# Patient Record
Sex: Female | Born: 1995 | Race: White | Hispanic: No | Marital: Single | Smoking: Former smoker
Health system: Southern US, Community
[De-identification: ages and names within clinical notes are randomized; demographics above are authoritative.]

## PROBLEM LIST (undated history)

## (undated) DIAGNOSIS — Z789 Other specified health status: Secondary | ICD-10-CM

## (undated) DIAGNOSIS — K029 Dental caries, unspecified: Secondary | ICD-10-CM

## (undated) HISTORY — DX: Dental caries, unspecified: K02.9

## (undated) HISTORY — PX: TYMPANOSTOMY TUBE PLACEMENT: SHX32

## (undated) HISTORY — DX: Other specified health status: Z78.9

---
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
## 2016-07-02 ENCOUNTER — Emergency Department (HOSPITAL_COMMUNITY)
Admission: EM | Admit: 2016-07-02 | Discharge: 2016-07-02 | Disposition: A | Discharge status: Home / Self Care | Payer: Medicaid Other | Attending: Emergency Medicine | Admitting: Emergency Medicine

## 2016-07-02 ENCOUNTER — Encounter (HOSPITAL_COMMUNITY): Payer: Self-pay | Admitting: Emergency Medicine

## 2016-07-02 DIAGNOSIS — F17210 Nicotine dependence, cigarettes, uncomplicated: Secondary | ICD-10-CM | POA: Insufficient documentation

## 2016-07-02 DIAGNOSIS — H00014 Hordeolum externum left upper eyelid: Secondary | ICD-10-CM | POA: Insufficient documentation

## 2016-07-02 DIAGNOSIS — H00016 Hordeolum externum left eye, unspecified eyelid: Secondary | ICD-10-CM

## 2016-07-02 DIAGNOSIS — H5712 Ocular pain, left eye: Secondary | ICD-10-CM | POA: Diagnosis present

## 2016-07-02 MED ORDER — TOBRAMYCIN 0.3 % OP SOLN
2.0000 [drp] | Freq: Once | OPHTHALMIC | Status: AC
  Administered 2016-07-02: 2 [drp] via OPHTHALMIC
  Filled 2016-07-02: qty 5

## 2016-07-02 MED ORDER — DEXAMETHASONE 4 MG PO TABS: 4.0000 mg | ORAL_TABLET | Freq: Two times a day (BID) | ORAL | Status: DC

## 2016-07-02 NOTE — ED Notes (Signed)
PT c/o left eye swelling and pain with upper eyelid x2 days with no injury or known irritant.

## 2016-07-02 NOTE — Discharge Instructions (Signed)
Stye A stye is a bump on your eyelid caused by a bacterial infection. A stye can form inside the eyelid (internal stye) or outside the eyelid (external stye). An internal stye may be caused by an infected oil-producing gland inside your eyelid. An external stye may be caused by an infection at the base of your eyelash (hair follicle). Styes are very common. Anyone can get them at any age. They usually occur in just one eye, but you may have more than one in either eye.  CAUSES  The infection is almost always caused by bacteria called Staphylococcus aureus. This is a common type of bacteria that lives on your skin. RISK FACTORS You may be at higher risk for a stye if you have had one before. You may also be at higher risk if you have:  Diabetes.  Long-term illness.  Long-term eye redness.  A skin condition called seborrhea.  High fat levels in your blood (lipids). SIGNS AND SYMPTOMS  Eyelid pain is the most common symptom of a stye. Internal styes are more painful than external styes. Other signs and symptoms may include:  Painful swelling of your eyelid.  A scratchy feeling in your eye.  Tearing and redness of your eye.  Pus draining from the stye. DIAGNOSIS  Your health care provider may be able to diagnose a stye just by examining your eye. The health care provider may also check to make sure:  You do not have a fever or other signs of a more serious infection.  The infection has not spread to other parts of your eye or areas around your eye. TREATMENT  Most styes will clear up in a few days without treatment. In some cases, you may need to use antibiotic drops or ointment to prevent infection. Your health care provider may have to drain the stye surgically if your stye is:  Large.  Causing a lot of pain.  Interfering with your vision. This can be done using a thin blade or a needle.  HOME CARE INSTRUCTIONS   Take medicines only as directed by your health care  provider.  Apply a clean, warm compress to your eye for 10 minutes, 4 times a day.  Do not wear contact lenses or eye makeup until your stye has healed.  Do not try to pop or drain the stye. SEEK MEDICAL CARE IF:  You have chills or a fever.  Your stye does not go away after several days.  Your stye affects your vision.  Your eyeball becomes swollen, red, or painful. MAKE SURE YOU:  Understand these instructions.  Will watch your condition.  Will get help right away if you are not doing well or get worse.   This information is not intended to replace advice given to you by your health care provider. Make sure you discuss any questions you have with your health care provider.   Document Released: 09/26/2005 Document Revised: 01/07/2015 Document Reviewed: 04/02/2014 Elsevier Interactive Patient Education 2016 [employer] Inc[employer]  

## 2016-07-02 NOTE — ED Provider Notes (Signed)
CSN: [id number]     Arrival date & time 07/02/16  1332 History  By signing my name below, I, Bridgette HabermannMaria Tan, attest that this documentation has been prepared under the direction and in the presence of Ivery QualeHobson Heran Campau, PA-C. Electronically Signed: Bridgette HabermannMaria Tan, ED Scribe. 07/02/2016. 3:29 PM.   Chief Complaint  Patient presents with  . Eye Problem    The history is provided by the patient. No language interpreter was used.    HPI Comments: Ashley Navarro is a 20 y.o. female who presents to the Emergency Department complaining of left eye pain and swelling onset three days ago. There is no discharge present. Pt has not had this issue before and has no history of seasonal allergies. No alleviating factors noted. Pt denies any injury or known irritant. Pt denies double/blurry vision.   History reviewed. No pertinent past medical history. History reviewed. No pertinent past surgical history. History reviewed. No pertinent family history. Social History  Substance Use Topics  . Smoking status: Current Every Day Smoker -- 0.50 packs/day    Types: Cigarettes  . Smokeless tobacco: None  . Alcohol Use: No   OB History    No data available     Review of Systems  Constitutional: Negative for fever.  Eyes: Positive for pain. Negative for visual disturbance.  All other systems reviewed and are negative.   Allergies  Review of patient's allergies indicates no known allergies.  Home Medications   Prior to Admission medications   Not on File   BP 116/64 mmHg  Pulse 94  Temp(Src) 98.7 F (37.1 C) (Oral)  Resp 16  Ht 5\' 1"  (1.549 m)  Wt 109 lb (49.442 kg)  BMI 20.61 kg/m2  SpO2 100%  LMP 07/01/2016 Physical Exam  Constitutional: She appears well-developed and well-nourished.  HENT:  Head: Normocephalic and atraumatic.  No cervical lymphadenopathy.   Eyes: Conjunctivae are normal.  Extraocular movements intact. Conjunctiva is clear bilaterally. Swelling and redness of left upper mucosa of  the lid. No involvement of the lower lid. There is a pointed stye on the mucosa of the left upper lid.   Cardiovascular: Normal rate, regular rhythm and normal heart sounds.   Pulmonary/Chest: Effort normal and breath sounds normal. No respiratory distress. She has no wheezes. She has no rales. She exhibits no tenderness.  Abdominal: She exhibits no distension.  Musculoskeletal: Normal range of motion.  Neurological: She is alert.  Skin: Skin is warm and dry.  Psychiatric: She has a normal mood and affect. Her behavior is normal.  Nursing note and vitals reviewed.   ED Course  Procedures  DIAGNOSTIC STUDIES: Oxygen Saturation is 100% on RA, normal by my interpretation.    COORDINATION OF CARE: 3:15 PM Discussed treatment plan with pt at bedside which includes Rx eyedrops and pt agreed to plan.  MDM The examination favors a stye of the left frontal lid. Patient treated with tobramycin drops, warm compresses, and Decadron. Patient will follow-up with ophthalmology if not improving.    Final diagnoses:  None    **I have reviewed nursing notes, vital signs, and all appropriate lab and imaging results for this patient.*  **I personally performed the services described in this documentation, which was scribed in my presence. The recorded information has been reviewed and is accurate.Ivery Quale*  Kelsen Celona, PA-C 07/03/16 1434  Blane OharaJoshua Zavitz, MD 07/03/16 2219

## 2016-07-22 ENCOUNTER — Emergency Department (HOSPITAL_COMMUNITY)
Admission: EM | Admit: 2016-07-22 | Discharge: 2016-07-23 | Disposition: A | Discharge status: Home / Self Care | Payer: Medicaid Other | Attending: Emergency Medicine | Admitting: Emergency Medicine

## 2016-07-22 ENCOUNTER — Encounter (HOSPITAL_COMMUNITY): Payer: Self-pay | Admitting: *Deleted

## 2016-07-22 ENCOUNTER — Emergency Department (HOSPITAL_COMMUNITY): Payer: Medicaid Other

## 2016-07-22 DIAGNOSIS — R519 Headache, unspecified: Secondary | ICD-10-CM

## 2016-07-22 DIAGNOSIS — R51 Headache: Secondary | ICD-10-CM | POA: Insufficient documentation

## 2016-07-22 DIAGNOSIS — R42 Dizziness and giddiness: Secondary | ICD-10-CM | POA: Insufficient documentation

## 2016-07-22 DIAGNOSIS — Z87891 Personal history of nicotine dependence: Secondary | ICD-10-CM | POA: Diagnosis not present

## 2016-07-22 LAB — COMPREHENSIVE METABOLIC PANEL
ALK PHOS: 62 U/L (ref 38–126)
ALT: 24 U/L (ref 14–54)
ANION GAP: 6 (ref 5–15)
AST: 22 U/L (ref 15–41)
Albumin: 4.3 g/dL (ref 3.5–5.0)
BUN: 9 mg/dL (ref 6–20)
CALCIUM: 8.6 mg/dL — AB (ref 8.9–10.3)
CHLORIDE: 103 mmol/L (ref 101–111)
CO2: 29 mmol/L (ref 22–32)
Creatinine, Ser: 0.58 mg/dL (ref 0.44–1.00)
GFR calc non Af Amer: >60 mL/min (ref >60)
GLUCOSE: 103 mg/dL — AB (ref 65–99)
POTASSIUM: 3.2 mmol/L — AB (ref 3.5–5.1)
SODIUM: 138 mmol/L (ref 135–145)
Total Bilirubin: 0.8 mg/dL (ref 0.3–1.2)
Total Protein: 7.3 g/dL (ref 6.5–8.1)

## 2016-07-22 LAB — CBC WITH DIFFERENTIAL/PLATELET
Basophils Absolute: 0 10*3/uL (ref 0.0–0.1)
Basophils Relative: 0 %
EOS ABS: 0.1 10*3/uL (ref 0.0–0.7)
EOS PCT: 1 %
HCT: 38.6 % (ref 36.0–46.0)
Hemoglobin: 13 g/dL (ref 12.0–15.0)
LYMPHS ABS: 0.8 10*3/uL (ref 0.7–4.0)
Lymphocytes Relative: 7 %
MCH: 30.1 pg (ref 26.0–34.0)
MCHC: 33.7 g/dL (ref 30.0–36.0)
MCV: 89.4 fL (ref 78.0–100.0)
MONO ABS: 0.7 10*3/uL (ref 0.1–1.0)
MONOS PCT: 7 %
Neutro Abs: 9.7 10*3/uL — ABNORMAL HIGH (ref 1.7–7.7)
Neutrophils Relative %: 85 %
PLATELETS: 178 10*3/uL (ref 150–400)
RBC: 4.32 MIL/uL (ref 3.87–5.11)
RDW: 12.6 % (ref 11.5–15.5)
WBC: 11.3 10*3/uL — ABNORMAL HIGH (ref 4.0–10.5)

## 2016-07-22 LAB — URINALYSIS, ROUTINE W REFLEX MICROSCOPIC
BILIRUBIN URINE: NEGATIVE
Glucose, UA: NEGATIVE mg/dL
HGB URINE DIPSTICK: NEGATIVE
Ketones, ur: NEGATIVE mg/dL
Leukocytes, UA: NEGATIVE
Nitrite: NEGATIVE
PH: 6 (ref 5.0–8.0)
Protein, ur: NEGATIVE mg/dL
SPECIFIC GRAVITY, URINE: 1.025 (ref 1.005–1.030)

## 2016-07-22 LAB — PREGNANCY, URINE: Preg Test, Ur: NEGATIVE

## 2016-07-22 MED ORDER — SODIUM CHLORIDE 0.9 % IV BOLUS (SEPSIS)
1000.0000 mL | Freq: Once | INTRAVENOUS | Status: AC
  Administered 2016-07-22: 1000 mL via INTRAVENOUS

## 2016-07-22 MED ORDER — PROCHLORPERAZINE EDISYLATE 5 MG/ML IJ SOLN
10.0000 mg | Freq: Once | INTRAMUSCULAR | Status: AC
  Administered 2016-07-22: 10 mg via INTRAVENOUS
  Filled 2016-07-22: qty 2

## 2016-07-22 MED ORDER — POTASSIUM CHLORIDE 20 MEQ/15ML (10%) PO SOLN
20.0000 meq | Freq: Once | ORAL | Status: AC
  Administered 2016-07-23: 20 meq via ORAL
  Filled 2016-07-22: qty 30

## 2016-07-22 NOTE — ED Provider Notes (Signed)
[facility]-EMERGENCY DEPT Provider Note   CSN: [id number] Arrival date & time: 07/22/16  2147  First Provider Contact: 07/22/2016 10:42 PM By signing my name below, I, [name], attest that this documentation has been prepared under the direction and in the presence of [name], MD. Electronically Signed: [name], ED Scribe. 07/22/16. 10:45 PM.    History   Chief Complaint Chief Complaint  Patient presents with  . Migraine    HPI Comments: Ashley Navarro is a 20 y.o. female who presents to the Emergency Department complaining of sudden onset, constant dizziness onset today. Pt notes she was sitting in church when the pain started. Pt also has associated intermittent, sharp, temporal headache. Pt is ambulatory. Pt had h/o migraines 2.5 years ago and had acupuncture done that stopped them. Pt denies any recent head injury. No alleviating factors noted. Pt is not a smoker. She denies chance of being pregnant. No known sick contacts with similar symptoms. Pt denies decreased appetite, blurry vision, nausea, and vomiting.   The history is provided by the patient. No language interpreter was used.    History reviewed. No pertinent past medical history.  There are no active problems to display for this patient.   History reviewed. No pertinent surgical history.  OB History    No data available       Home Medications    Prior to Admission medications   Medication Sig Start Date End Date Taking? Authorizing Provider  dexamethasone (DECADRON) 4 MG tablet Take 1 tablet (4 mg total) by mouth 2 (two) times daily with a meal. Patient not taking: Reported on 07/22/2016 07/02/16   Ivery Quale, PA-C    Family History History reviewed. No pertinent family history.  Social History Social History  Substance Use Topics  . Smoking status: Former Smoker    Packs/day: 0.50    Types: Cigarettes  . Smokeless tobacco: Never Used  . Alcohol use No     Allergies   Review of patient's  allergies indicates no known allergies.   Review of Systems Review of Systems  Constitutional: Negative for appetite change and fever.  Eyes: Negative for visual disturbance.  Gastrointestinal: Negative for nausea and vomiting.  Neurological: Positive for dizziness and headaches.     Physical Exam Updated Vital Signs BP 129/76 (BP Location: Left Arm)   Pulse 110   Temp 99.5 F (37.5 C) (Oral)   Resp 18   Ht 5' 1.5" (1.562 m)   Wt 109 lb (49.4 kg)   LMP 07/01/2016   SpO2 100%   BMI 20.26 kg/m   Physical Exam  Constitutional: She is oriented to person, place, and time. She appears well-developed and well-nourished.  HENT:  Head: Normocephalic and atraumatic.  Right Ear: Tympanic membrane and external ear normal.  Left Ear: Tympanic membrane and external ear normal.  Nose: Nose normal. Right sinus exhibits no maxillary sinus tenderness and no frontal sinus tenderness. Left sinus exhibits no maxillary sinus tenderness and no frontal sinus tenderness.  Eyes: Conjunctivae and EOM are normal. Pupils are equal, round, and reactive to light. Right eye exhibits no nystagmus. Left eye exhibits no nystagmus.  Neck: Normal range of motion. Neck supple.  Cardiovascular: Normal rate, regular rhythm, normal heart sounds and intact distal pulses.   Pulmonary/Chest: Effort normal and breath sounds normal. No respiratory distress. She exhibits no tenderness.  Abdominal: Soft. Bowel sounds are normal. She exhibits no distension and no mass. There is no tenderness.  Musculoskeletal: Normal range of motion. She  exhibits no edema or tenderness.  Neurological: She is alert and oriented to person, place, and time. She has normal strength and normal reflexes. No sensory deficit. She displays a negative Romberg sign. GCS eye subscore is 4. GCS verbal subscore is 5. GCS motor subscore is 6. She displays no Babinski's sign on the right side. She displays no Babinski's sign on the left side.  Reflex  Scores:      Tricep reflexes are 2+ on the right side and 2+ on the left side.      Bicep reflexes are 2+ on the right side and 2+ on the left side.      Brachioradialis reflexes are 2+ on the right side and 2+ on the left side.      Patellar reflexes are 2+ on the right side and 2+ on the left side.      Achilles reflexes are 2+ on the right side and 2+ on the left side. Patient with normal gait without ataxia, shuffling, spasm, or antalgia. Speech is normal without dysarthria, dysphasia, or aphasia. Muscle strength is 5/5 in bilateral shoulders, elbow flexor and extensors, wrist flexor and extensors, and intrinsic hand muscles. 5/5 bilateral lower extremity hip flexors, extensors, knee flexors and extensors, and ankle dorsi and plantar flexors.    Skin: Skin is warm and dry. No rash noted.  Psychiatric: She has a normal mood and affect. Her behavior is normal. Judgment and thought content normal.  Nursing note and vitals reviewed.    ED Treatments / Results  DIAGNOSTIC STUDIES: Oxygen Saturation is 100% on RA, normal by my interpretation.    COORDINATION OF CARE: 10:44 PM Discussed treatment plan with pt at bedside which includes head CT and pt agreed to plan.  Labs (all labs ordered are listed, but only abnormal results are displayed) Labs Reviewed  CBC WITH DIFFERENTIAL/PLATELET - Abnormal; Notable for the following:       Result Value   WBC 11.3 (*)    Neutro Abs 9.7 (*)    All other components within normal limits  COMPREHENSIVE METABOLIC PANEL - Abnormal; Notable for the following:    Potassium 3.2 (*)    Glucose, Bld 103 (*)    Calcium 8.6 (*)    All other components within normal limits  PREGNANCY, URINE  URINALYSIS, ROUTINE W REFLEX MICROSCOPIC (NOT AT [facility])    EKG  EKG Interpretation None       Radiology No results found.  Procedures Procedures (including critical care time)  Medications Ordered in ED Medications - No data to display   Initial  Impression / Assessment and Plan / ED Course  I have reviewed the triage vital signs and the nursing notes.  Pertinent labs & imaging results that were available during my care of the patient were reviewed by me and considered in my medical decision making (see chart for details).  Clinical Course     Final Clinical Impressions(s) / ED Diagnoses   Final diagnoses:  Acute nonintractable headache, unspecified headache type    New Prescriptions New Prescriptions   No medications on file  I personally performed the services described in this documentation, which was scribed in my presence. The recorded information has been reviewed and considered.    [name], MD 07/23/16 [phone]

## 2016-07-22 NOTE — ED Triage Notes (Signed)
Pt reports sitting in church and having a sharp pain in her left temple. Pt states she felt dizzy and every time she moves, she feels light headed and now has a headache. Pt denies any recent head injury, urinary symptoms or decreased appetite.

## 2016-07-22 NOTE — ED Notes (Signed)
Pt reports a history of migraines- never physician diagnosed. She reports photophobia and headache behind her left eye. She states this HA started at about 1900 and that she became dizzy when standing. She is neuro intact moves, ad lib, answers questions appropriately and is awaiting physician evaluation- Education: early delivery of pain meds to see if it can stop or decrease pain, migraine diary to see if can pinpoint the triggers of migraine, that we will give number with discharge for finding a PCP as pt reports that she has none

## 2016-07-23 MED ORDER — IBUPROFEN 800 MG PO TABS
800.0000 mg | ORAL_TABLET | Freq: Once | ORAL | Status: AC
  Administered 2016-07-23: 800 mg via ORAL
  Filled 2016-07-23: qty 1

## 2017-02-10 ENCOUNTER — Emergency Department (HOSPITAL_COMMUNITY)
Admission: EM | Admit: 2017-02-10 | Discharge: 2017-02-10 | Disposition: A | Discharge status: Home / Self Care | Payer: Medicaid Other | Attending: Emergency Medicine | Admitting: Emergency Medicine

## 2017-02-10 ENCOUNTER — Encounter (HOSPITAL_COMMUNITY): Payer: Self-pay | Admitting: Emergency Medicine

## 2017-02-10 DIAGNOSIS — M791 Myalgia: Secondary | ICD-10-CM | POA: Insufficient documentation

## 2017-02-10 DIAGNOSIS — Z87891 Personal history of nicotine dependence: Secondary | ICD-10-CM | POA: Diagnosis not present

## 2017-02-10 DIAGNOSIS — J020 Streptococcal pharyngitis: Secondary | ICD-10-CM | POA: Diagnosis not present

## 2017-02-10 DIAGNOSIS — J029 Acute pharyngitis, unspecified: Secondary | ICD-10-CM | POA: Diagnosis present

## 2017-02-10 LAB — RAPID STREP SCREEN (MED CTR MEBANE ONLY): STREPTOCOCCUS, GROUP A SCREEN (DIRECT): POSITIVE — AB

## 2017-02-10 MED ORDER — AMOXICILLIN 500 MG PO TABS: 500.0000 mg | ORAL_TABLET | Freq: Two times a day (BID) | ORAL | 0 refills | Status: DC

## 2017-02-10 MED ORDER — IBUPROFEN 600 MG PO TABS: 600.0000 mg | ORAL_TABLET | Freq: Four times a day (QID) | ORAL | 0 refills | Status: DC | PRN

## 2017-02-10 MED ORDER — AMOXICILLIN 250 MG PO CAPS
500.0000 mg | ORAL_CAPSULE | Freq: Once | ORAL | Status: AC
  Administered 2017-02-10: 500 mg via ORAL
  Filled 2017-02-10: qty 2

## 2017-02-10 MED ORDER — ONDANSETRON HCL 4 MG PO TABS: 4.0000 mg | ORAL_TABLET | Freq: Four times a day (QID) | ORAL | 0 refills | Status: DC

## 2017-02-10 MED ORDER — SODIUM CHLORIDE 0.9 % IV BOLUS (SEPSIS)
1000.0000 mL | Freq: Once | INTRAVENOUS | Status: AC
  Administered 2017-02-10: 1000 mL via INTRAVENOUS

## 2017-02-10 NOTE — ED Provider Notes (Signed)
[facility] DEPT Provider Note   CSN: [id number] Arrival date & time: 02/10/17  1646  By signing my name below, I, Cynda AcresHailei Fulton, attest that this documentation has been prepared under the direction and in the presence of Buel ReamAlexandra Saydee Zolman PA-C Electronically Signed: Cynda AcresHailei Fulton, Scribe. 02/10/17. 7:04 PM.   History   Chief Complaint Chief Complaint  Patient presents with  . Fever  . Nausea    HPI Comments: Ashley Navarro is a 21 y.o. female who presents to the Emergency Department complaining of a gradual onset, constant sore throat that began yesterday. Patient has associated fever, generalized body aches, nausea, trouble breathing, poor appetite, and a headache. Patient has not been drinking very much fluid because of pain with swallowing. Patient reports taking Dayquil and Nyquil with no improvement. Patient denies any strep throat exposure. Denies any allergies. Patient denies any abdominal pain, vomiting, diarrhea, ear pain, or urinary symptoms.   The history is provided by the patient. No language interpreter was used.    History reviewed. No pertinent past medical history.  There are no active problems to display for this patient.   History reviewed. No pertinent surgical history.  OB History    No data available       Home Medications    Prior to Admission medications   Medication Sig Start Date End Date Taking? Authorizing Provider  amoxicillin (AMOXIL) 500 MG tablet Take 1 tablet (500 mg total) by mouth 2 (two) times daily. 02/10/17   Emi HolesAlexandra M Corby Villasenor, PA-C  ibuprofen (ADVIL,MOTRIN) 600 MG tablet Take 1 tablet (600 mg total) by mouth every 6 (six) hours as needed. 02/10/17   Claudia Greenley M Abrham Maslowski, PA-C  ondansetron (ZOFRAN) 4 MG tablet Take 1 tablet (4 mg total) by mouth every 6 (six) hours. 02/10/17   Emi HolesAlexandra M Ezriel Boffa, PA-C    Family History No family history on file.  Social History Social History  Substance Use Topics  . Smoking status: Former Smoker   Packs/day: 0.50    Types: Cigarettes  . Smokeless tobacco: Never Used  . Alcohol use No     Allergies   Patient has no known allergies.   Review of Systems Review of Systems  Constitutional: Positive for appetite change and fever. Negative for chills.  HENT: Positive for sore throat. Negative for congestion and facial swelling.   Respiratory: Negative for cough and shortness of breath.   Cardiovascular: Negative for chest pain.  Gastrointestinal: Positive for nausea. Negative for abdominal pain and vomiting.  Genitourinary: Negative for dysuria.  Musculoskeletal: Positive for myalgias. Negative for back pain.  Skin: Negative for rash and wound.  Neurological: Positive for headaches.  Psychiatric/Behavioral: The patient is not nervous/anxious.      Physical Exam Updated Vital Signs BP 102/58 (BP Location: Left Arm)   Pulse 98   Temp 100.6 F (38.1 C) (Oral)   Resp 16   Ht 5\' 1"  (1.549 m)   Wt 54.4 kg   LMP 01/10/2017   SpO2 100%   BMI 22.67 kg/m   Physical Exam  Constitutional: She appears well-developed and well-nourished. No distress.  HENT:  Head: Normocephalic and atraumatic.  Mouth/Throat: Posterior oropharyngeal edema and posterior oropharyngeal erythema present. No oropharyngeal exudate or tonsillar abscesses. Tonsils are 1+ on the right. Tonsils are 1+ on the left. No tonsillar exudate.  Palatal petechiae.   Eyes: Conjunctivae are normal. Pupils are equal, round, and reactive to light. Right eye exhibits no discharge. Left eye exhibits no discharge. No scleral icterus.  Neck:  Normal range of motion. Neck supple. No thyromegaly present.  Cardiovascular: Normal rate, regular rhythm, normal heart sounds and intact distal pulses.  Exam reveals no gallop and no friction rub.   No murmur heard. Pulmonary/Chest: Effort normal and breath sounds normal. No stridor. No respiratory distress. She has no wheezes. She has no rales.  Abdominal: Soft. Bowel sounds are  normal. She exhibits no distension. There is no tenderness. There is no rebound and no guarding.  Musculoskeletal: She exhibits no edema.  Lymphadenopathy:    She has no cervical adenopathy.  Neurological: She is alert. Coordination normal.  Skin: Skin is warm and dry. No rash noted. She is not diaphoretic. No pallor.  Psychiatric: She has a normal mood and affect.  Nursing note and vitals reviewed.    ED Treatments / Results  DIAGNOSTIC STUDIES: Oxygen Saturation is 99% on RA, normal by my interpretation.    COORDINATION OF CARE: 7:02 PM Discussed treatment plan with pt at bedside and pt agreed to plan, which includes antibiotics.   Labs (all labs ordered are listed, but only abnormal results are displayed) Labs Reviewed  RAPID STREP SCREEN (NOT AT [facility]) - Abnormal; Notable for the following:       Result Value   Streptococcus, Group A Screen (Direct) POSITIVE (*)    All other components within normal limits    EKG  EKG Interpretation None       Radiology No results found.  Procedures Procedures (including critical care time)  Medications Ordered in ED Medications  sodium chloride 0.9 % bolus 1,000 mL (0 mLs Intravenous Stopped 02/10/17 2020)  amoxicillin (AMOXIL) capsule 500 mg (500 mg Oral Given 02/10/17 1914)     Initial Impression / Assessment and Plan / ED Course  I have reviewed the triage vital signs and the nursing notes.  Pertinent labs & imaging results that were available during my care of the patient were reviewed by me and considered in my medical decision making (see chart for details).     Pt rapid strep test positive. Pt is tolerating secretions. Presentation not concerning for peritonsillar abscess or spread of infection to deep spaces of the throat; patent airway. Patient given 1 L of fluids in the ED because of reported dehydration and pressure, however patient is young, thin female and may have slightly low blood pressure at baseline, as her  pressure was not significantly improved after fluids. Patient discharged with advice to drink plenty of fluids. Patient given ibuprofen and Zofran for symptomatic treatment. Pt will be discharged with amoxicillin.  Specific return precautions discussed. Recommended PCP follow up. Patient understands and agrees with plan. Patient discharged in satisfactory condition.   Final Clinical Impressions(s) / ED Diagnoses   Final diagnoses:  Strep pharyngitis    New Prescriptions Discharge Medication List as of 02/10/2017  8:21 PM    START taking these medications   Details  amoxicillin (AMOXIL) 500 MG tablet Take 1 tablet (500 mg total) by mouth 2 (two) times daily., Starting Sun 02/10/2017, Print    ibuprofen (ADVIL,MOTRIN) 600 MG tablet Take 1 tablet (600 mg total) by mouth every 6 (six) hours as needed., Starting Sun 02/10/2017, Print    ondansetron (ZOFRAN) 4 MG tablet Take 1 tablet (4 mg total) by mouth every 6 (six) hours., Starting Sun 02/10/2017, Print       I personally performed the services described in this documentation, which was scribed in my presence. The recorded information has been reviewed and is accurate.  Emi Holes, PA-C 02/11/17 4098    Vanetta Mulders, MD 02/14/17 [phone]

## 2017-02-10 NOTE — ED Notes (Signed)
Pt with chills, nausea and body aches since yesterday. Denies emesis or diarrhea

## 2017-02-10 NOTE — Discharge Instructions (Signed)
Medications: Amoxicillin, ibuprofen, Zofran  Treatment: Take amoxicillin twice daily for 10 days. Make sure to finish all this medication. Take ibuprofen every 6 hours as needed. You can alternate with Tylenol as prescribed over-the-counter 3 hours after taking ibuprofen. Make sure to drink plenty of fluids. You can take Zofran every 6 hours as needed for nausea or vomiting.  Follow-up: Please return to emergency department if you develop any new or worsening symptoms including asymmetry in the back of your throat, inability to open your mouth, drooling, worsening difficulty breathing, or any other

## 2017-02-10 NOTE — ED Triage Notes (Signed)
Patient complains of body aches, sore throat, and nausea. Denies vomiting.

## 2017-02-28 ENCOUNTER — Emergency Department (HOSPITAL_COMMUNITY)
Admission: EM | Admit: 2017-02-28 | Discharge: 2017-02-28 | Disposition: A | Discharge status: Home / Self Care | Payer: Medicaid Other | Attending: Emergency Medicine | Admitting: Emergency Medicine

## 2017-02-28 ENCOUNTER — Encounter (HOSPITAL_COMMUNITY): Payer: Self-pay

## 2017-02-28 DIAGNOSIS — R51 Headache: Secondary | ICD-10-CM | POA: Insufficient documentation

## 2017-02-28 DIAGNOSIS — R0981 Nasal congestion: Secondary | ICD-10-CM | POA: Insufficient documentation

## 2017-02-28 DIAGNOSIS — R0982 Postnasal drip: Secondary | ICD-10-CM | POA: Insufficient documentation

## 2017-02-28 DIAGNOSIS — H9202 Otalgia, left ear: Secondary | ICD-10-CM | POA: Diagnosis present

## 2017-02-28 DIAGNOSIS — Z87891 Personal history of nicotine dependence: Secondary | ICD-10-CM | POA: Insufficient documentation

## 2017-02-28 MED ORDER — OFLOXACIN 0.3 % OT SOLN: 5.0000 [drp] | Freq: Two times a day (BID) | OTIC | 0 refills | Status: DC

## 2017-02-28 NOTE — ED Triage Notes (Signed)
C/o ear pain since last pm

## 2017-02-28 NOTE — Discharge Instructions (Signed)
Your vital signs are within normal limits. Your being treated with ofloxacin to be used 2 times daily, and diclofenac also to be used 2 times daily for pain and inflammation. May use Tylenol Extra Strength in between the doses. Please see Dr.Teoh for ear nose and throat evaluation if not improving.

## 2017-02-28 NOTE — ED Provider Notes (Signed)
[facility] Provider Note   CSN: [id number] Arrival date & time: 02/28/17  1141     History   Chief Complaint Chief Complaint  Patient presents with  . Otalgia    HPI Ashley Navarro is a 21 y.o. female.  The history is provided by the patient.  Otalgia  This is a new problem. The current episode started yesterday. There is pain in the left ear. The problem occurs hourly. The problem has been gradually worsening. There has been no fever. The pain is moderate. Associated symptoms include headaches. Pertinent negatives include no ear discharge, no diarrhea, no vomiting and no cough. Her past medical history does not include chronic ear infection or hearing loss.    History reviewed. No pertinent past medical history.  There are no active problems to display for this patient.   History reviewed. No pertinent surgical history.  OB History    No data available       Home Medications    Prior to Admission medications   Medication Sig Start Date End Date Taking? Authorizing Provider  amoxicillin (AMOXIL) 500 MG tablet Take 1 tablet (500 mg total) by mouth 2 (two) times daily. 02/10/17   Emi HolesAlexandra M Law, PA-C  ibuprofen (ADVIL,MOTRIN) 600 MG tablet Take 1 tablet (600 mg total) by mouth every 6 (six) hours as needed. 02/10/17   Alexandra M Law, PA-C  ondansetron (ZOFRAN) 4 MG tablet Take 1 tablet (4 mg total) by mouth every 6 (six) hours. 02/10/17   Emi HolesAlexandra M Law, PA-C    Family History No family history on file.  Social History Social History  Substance Use Topics  . Smoking status: Former Smoker    Packs/day: 0.50    Types: Cigarettes  . Smokeless tobacco: Never Used  . Alcohol use No     Allergies   Patient has no known allergies.   Review of Systems Review of Systems  Constitutional: Negative for chills and fever.  HENT: Positive for congestion, ear pain and postnasal drip. Negative for ear discharge.   Respiratory: Negative for cough.     Gastrointestinal: Negative for diarrhea and vomiting.  Neurological: Positive for headaches.  All other systems reviewed and are negative.    Physical Exam Updated Vital Signs BP 113/58 (BP Location: Right Arm)   Pulse 70   Temp 98.4 F (36.9 C) (Oral)   LMP 02/03/2017   SpO2 100%   Physical Exam  Constitutional: She is oriented to person, place, and time. She appears well-developed and well-nourished.  Non-toxic appearance.  HENT:  Head: Normocephalic.  Right Ear: External ear and ear canal normal. No mastoid tenderness. Tympanic membrane is not injected, not perforated and not erythematous. A middle ear effusion is present.  Left Ear: External ear and ear canal normal. There is tenderness. No mastoid tenderness. Tympanic membrane is not injected, not perforated and not erythematous. A middle ear effusion is present.  Pain with movement of the left ear. Pain during exam of the left ear. Nasal congestion present.  Eyes: EOM and lids are normal. Pupils are equal, round, and reactive to light.  Neck: Normal range of motion. Neck supple. Carotid bruit is not present.  Cardiovascular: Normal rate, regular rhythm, normal heart sounds, intact distal pulses and normal pulses.   Pulmonary/Chest: Breath sounds normal. No respiratory distress.  Abdominal: Soft. Bowel sounds are normal. There is no tenderness. There is no guarding.  Musculoskeletal: Normal range of motion.  Lymphadenopathy:       Head (right side):  No submandibular adenopathy present.       Head (left side): No submandibular adenopathy present.    She has no cervical adenopathy.  Neurological: She is alert and oriented to person, place, and time. She has normal strength. No cranial nerve deficit or sensory deficit.  Skin: Skin is warm and dry.  Psychiatric: She has a normal mood and affect. Her speech is normal.  Nursing note and vitals reviewed.    ED Treatments / Results  Labs (all labs ordered are listed, but only  abnormal results are displayed) Labs Reviewed - No data to display  EKG  EKG Interpretation None       Radiology No results found.  Procedures Procedures (including critical care time)  Medications Ordered in ED Medications - No data to display   Initial Impression / Assessment and Plan / ED Course  I have reviewed the triage vital signs and the nursing notes.  Pertinent labs & imaging results that were available during my care of the patient were reviewed by me and considered in my medical decision making (see chart for details).     **I have reviewed nursing notes, vital signs, and all appropriate lab and imaging results for this patient.*  Final Clinical Impressions(s) / ED Diagnoses MDM The patient was recently treated for an upper respiratory infection on February 11. The vital signs within normal limits. There is pain to movement of the right ear. There is fluid behind the right and left TM. There is no bulging appreciated. The patient will be treated with antibiotic eyedrops. She will use Tylenol and ibuprofen for soreness. I've instructed her to use decongestant to assist with the congestion in the nasal passages. She will see her primary physician or return to the emergency department if not improving.    Final diagnoses:  Left ear pain    New Prescriptions Discharge Medication List as of 02/28/2017  2:03 PM    START taking these medications   Details  ofloxacin (FLOXIN) 0.3 % otic solution Place 5 drops into the left ear 2 (two) times daily., Starting Thu 02/28/2017, Print         Ivery Quale, PA-C 03/01/17 1122    Cathren Laine, MD 03/01/17 [phone]

## 2017-05-28 ENCOUNTER — Encounter (HOSPITAL_COMMUNITY): Payer: Self-pay | Admitting: *Deleted

## 2017-05-28 ENCOUNTER — Emergency Department (HOSPITAL_COMMUNITY)
Admission: EM | Admit: 2017-05-28 | Discharge: 2017-05-28 | Disposition: A | Discharge status: Home / Self Care | Payer: Medicaid Other | Attending: Emergency Medicine | Admitting: Emergency Medicine

## 2017-05-28 DIAGNOSIS — Z87891 Personal history of nicotine dependence: Secondary | ICD-10-CM | POA: Diagnosis not present

## 2017-05-28 DIAGNOSIS — R21 Rash and other nonspecific skin eruption: Secondary | ICD-10-CM | POA: Diagnosis present

## 2017-05-28 DIAGNOSIS — Z79899 Other long term (current) drug therapy: Secondary | ICD-10-CM | POA: Diagnosis not present

## 2017-05-28 DIAGNOSIS — L249 Irritant contact dermatitis, unspecified cause: Secondary | ICD-10-CM | POA: Diagnosis not present

## 2017-05-28 MED ORDER — DIPHENHYDRAMINE HCL 25 MG PO CAPS
25.0000 mg | ORAL_CAPSULE | Freq: Once | ORAL | Status: AC
  Administered 2017-05-28: 25 mg via ORAL
  Filled 2017-05-28: qty 1

## 2017-05-28 MED ORDER — PREDNISONE 10 MG PO TABS: ORAL_TABLET | ORAL | 0 refills | Status: DC

## 2017-05-28 MED ORDER — PREDNISONE 50 MG PO TABS
60.0000 mg | ORAL_TABLET | Freq: Once | ORAL | Status: AC
  Administered 2017-05-28: 60 mg via ORAL
  Filled 2017-05-28: qty 1

## 2017-05-28 NOTE — ED Notes (Signed)
Pt alert & oriented x4, stable gait. Patient given discharge instructions, paperwork & prescription(s). Patient  instructed to stop at the registration desk to finish any additional paperwork. Patient verbalized understanding. Pt left department w/ no further questions. 

## 2017-05-28 NOTE — ED Notes (Signed)
Pt states rash to her face started this morning. Denies any new meds, soaps, make-up or foods.

## 2017-05-28 NOTE — Discharge Instructions (Signed)
You may apply a small amt of 1% hydrocortisone cream 3 times a day to the affected areas.  Take benadryl one capsule every 4 hrs for at least 3 days.  Return here for any worsening symptoms

## 2017-05-28 NOTE — ED Triage Notes (Addendum)
Pt reports having a red area on her abdomen (looks like an insect bite). Pt states that this morning her face felt red and hot and she felt like she was breaking out around her mouth.  Pt states her face is burning and she feels like her bottom lip is swollen as well and can feel her "heartbeat" in it.  [place] and people were having allergic reactions due to the chemicals that have been put in the water to clear the water up. Minimal swelling noticed to face and lip.

## 2017-05-31 NOTE — ED Provider Notes (Signed)
[facility] Provider Note   CSN: [id number] Arrival date & time: 05/28/17  1927     History   Chief Complaint Chief Complaint  Patient presents with  . Allergic Reaction    HPI Ashley Navarro is a 21 y.o. female.  HPI  Ashley Navarro is a 21 y.o. female who presents to the Emergency Department complaining of multiple insect bites to her abdomen, rash around her mouth associated with swelling, burning and itching of her lower face.  Symptoms present for several hours. States that she was exposed to some type of chemicals [place].  She describes swelling to lower lip and "tightness" of the skin to her lower face.  She denies sore throat, tongue swelling, difficulty swallowing, wheezing or shortness of breath.  Also denies new foods, medications.     History reviewed. No pertinent past medical history.  There are no active problems to display for this patient.   History reviewed. No pertinent surgical history.  OB History    No data available       Home Medications    Prior to Admission medications   Medication Sig Start Date End Date Taking? Authorizing Provider  amoxicillin (AMOXIL) 500 MG tablet Take 1 tablet (500 mg total) by mouth 2 (two) times daily. 02/10/17   Law, Waylan BogaAlexandra M, PA-C  ibuprofen (ADVIL,MOTRIN) 600 MG tablet Take 1 tablet (600 mg total) by mouth every 6 (six) hours as needed. 02/10/17   Law, Waylan BogaAlexandra M, PA-C  ofloxacin (FLOXIN) 0.3 % otic solution Place 5 drops into the left ear 2 (two) times daily. 02/28/17   Ivery QualeBryant, [place], PA-C  ondansetron (ZOFRAN) 4 MG tablet Take 1 tablet (4 mg total) by mouth every 6 (six) hours. 02/10/17   Law, Waylan BogaAlexandra M, PA-C  predniSONE (DELTASONE) 10 MG tablet Take 6 tablets day one, 5 tablets day two, 4 tablets day three, 3 tablets day four, 2 tablets day five, then 1 tablet day six 05/28/17   Pauline Ausriplett, Hesston Hitchens, PA-C    Family History No family history on file.  Social History Social History    Substance Use Topics  . Smoking status: Former Smoker    Packs/day: 0.50    Types: Cigarettes  . Smokeless tobacco: Never Used  . Alcohol use No     Allergies   Patient has no known allergies.   Review of Systems Review of Systems  Constitutional: Negative for activity change, appetite change, chills and fever.  HENT: Negative for facial swelling, sore throat, trouble swallowing and voice change.        Facial rash and swelling to lower face  Respiratory: Negative for chest tightness, shortness of breath and wheezing.   Cardiovascular: Negative for chest pain.  Gastrointestinal: Negative for abdominal pain, nausea and vomiting.  Musculoskeletal: Negative for arthralgias, neck pain and neck stiffness.  Skin: Positive for rash (face and abdomen). Negative for wound.  Neurological: Negative for dizziness, weakness, numbness and headaches.  All other systems reviewed and are negative.    Physical Exam Updated Vital Signs BP 104/60 (BP Location: Right Arm)   Pulse 77   Temp 98.9 F (37.2 C) (Oral)   Resp 18   Ht 5\' 2"  (1.575 m)   Wt 52.2 kg (115 lb)   LMP 04/30/2017   SpO2 100%   BMI 21.03 kg/m   Physical Exam  Constitutional: She is oriented to person, place, and time. She appears well-developed and well-nourished. No distress.  HENT:  Head: Normocephalic and atraumatic.  Right Ear: Tympanic membrane and ear canal normal.  Left Ear: Tympanic membrane and ear canal normal.  Mouth/Throat: Uvula is midline, oropharynx is clear and moist and mucous membranes are normal. No trismus in the jaw. No uvula swelling. No posterior oropharyngeal edema or posterior oropharyngeal erythema.  Perioral erythematous lesions with some scaling present.  Mild edema of the lower face without obvious edema of the lower lip.  Airway patent, no edema of the tongue.    Eyes: EOM are normal. Pupils are equal, round, and reactive to light.  Neck: Trachea normal, normal range of motion, full  passive range of motion without pain and phonation normal. Neck supple.  Cardiovascular: Normal rate, regular rhythm, normal heart sounds and intact distal pulses.   No murmur heard. Pulmonary/Chest: Effort normal and breath sounds normal. No stridor. No respiratory distress. She has no wheezes.  Musculoskeletal: Normal range of motion. She exhibits no edema or tenderness.  Lymphadenopathy:    She has no cervical adenopathy.  Neurological: She is alert and oriented to person, place, and time. No sensory deficit. She exhibits normal muscle tone. Coordination normal.  Skin: Skin is warm. Capillary refill takes less than 2 seconds. Rash noted. There is erythema.  Erythematous papules to the mid and upper abdomen, no edema   Psychiatric: She has a normal mood and affect.  Nursing note and vitals reviewed.    ED Treatments / Results  Labs (all labs ordered are listed, but only abnormal results are displayed) Labs Reviewed - No data to display  EKG  EKG Interpretation None       Radiology No results found.  Procedures Procedures (including critical care time)  Medications Ordered in ED Medications  diphenhydrAMINE (BENADRYL) capsule 25 mg (25 mg Oral Given 05/28/17 2213)  predniSONE (DELTASONE) tablet 60 mg (60 mg Oral Given 05/28/17 2213)     Initial Impression / Assessment and Plan / ED Course  I have reviewed the triage vital signs and the nursing notes.  Pertinent labs & imaging results that were available during my care of the patient were reviewed by me and considered in my medical decision making (see chart for details).     Pt with rash to abdomen and lower face.  No respiratory distress, airway patent.  Vitals stable. No concerning sx's for angioedema.  Pt agrees to cool compresses, steroids, and benadryl.  Strict return precautions discussed.    Final Clinical Impressions(s) / ED Diagnoses   Final diagnoses:  Irritant contact dermatitis, unspecified trigger     New Prescriptions Discharge Medication List as of 05/28/2017 10:15 PM    START taking these medications   Details  predniSONE (DELTASONE) 10 MG tablet Take 6 tablets day one, 5 tablets day two, 4 tablets day three, 3 tablets day four, 2 tablets day five, then 1 tablet day six, Print         Pauline Aus, PA-C 05/31/17 1320    Loren Racer, MD 06/06/17 1433

## 2017-06-25 ENCOUNTER — Encounter (HOSPITAL_COMMUNITY): Payer: Self-pay | Admitting: Emergency Medicine

## 2017-06-25 ENCOUNTER — Emergency Department (HOSPITAL_COMMUNITY)
Admission: EM | Admit: 2017-06-25 | Discharge: 2017-06-25 | Disposition: A | Discharge status: Home / Self Care | Payer: Medicaid Other | Attending: Emergency Medicine | Admitting: Emergency Medicine

## 2017-06-25 ENCOUNTER — Emergency Department (HOSPITAL_COMMUNITY): Payer: Medicaid Other

## 2017-06-25 DIAGNOSIS — R05 Cough: Secondary | ICD-10-CM

## 2017-06-25 DIAGNOSIS — Z87891 Personal history of nicotine dependence: Secondary | ICD-10-CM | POA: Diagnosis not present

## 2017-06-25 DIAGNOSIS — R059 Cough, unspecified: Secondary | ICD-10-CM

## 2017-06-25 DIAGNOSIS — Z79899 Other long term (current) drug therapy: Secondary | ICD-10-CM | POA: Insufficient documentation

## 2017-06-25 MED ORDER — BENZONATATE 100 MG PO CAPS: 100.0000 mg | ORAL_CAPSULE | Freq: Three times a day (TID) | ORAL | 0 refills | Status: DC

## 2017-06-25 NOTE — ED Notes (Addendum)
Pt made aware to return if symptoms worsen or if any life threatening symptoms occur.  Elizabeth notified of 101/71 bp and that pt felt somewhat lightheaded.  Pt given water to drink and Lanora Manislizabeth is okay with d/c at this time.

## 2017-06-25 NOTE — ED Triage Notes (Signed)
Pt reports nonproductive cough for about 4 days.

## 2017-06-25 NOTE — Discharge Instructions (Signed)
Please take Ibuprofen (Advil, motrin) and Tylenol (acetaminophen) to relieve your pain.  You may take up to 800 MG (4 pills) of normal strength ibuprofen every 8 hours as needed.  In between doses of ibuprofen you make take tylenol, up to 1,000 mg (two extra strength pills).  Do not take more than 3,000 mg tylenol in a 24 hour period.  Please check all medication labels as many medications such as pain and cold medications may contain tylenol.  Do not drink alcohol while taking these medications.  Do not take other NSAID'S while taking ibuprofen (such as aleve or naproxen).  Please take ibuprofen with food to decrease stomach upset. ° ° °

## 2017-06-26 NOTE — ED Provider Notes (Signed)
[facility]-EMERGENCY DEPT Provider Note   CSN: [id number] Arrival date & time: 06/25/17  1115     History   Chief Complaint Chief Complaint  Patient presents with  . Cough    HPI Ashley Navarro is a 21 y.o. female who presents with non productive cough for 4 days.  She also has post nasal drip, and nasal congestion.  No fevers, chills, abdominal pain.  Minor parasternal chest pain that happens only when she is coughing.  She is asymptomatic when not coughing.  Her pain does not radiate.   HPI  History reviewed. No pertinent past medical history.  There are no active problems to display for this patient.   History reviewed. No pertinent surgical history.  OB History    No data available       Home Medications    Prior to Admission medications   Medication Sig Start Date End Date Taking? Authorizing Provider  amoxicillin (AMOXIL) 500 MG tablet Take 1 tablet (500 mg total) by mouth 2 (two) times daily. 02/10/17   Law, Waylan Boga, PA-C  benzonatate (TESSALON) 100 MG capsule Take 1 capsule (100 mg total) by mouth every 8 (eight) hours. 06/25/17   Cristina Gong, PA-C  ibuprofen (ADVIL,MOTRIN) 600 MG tablet Take 1 tablet (600 mg total) by mouth every 6 (six) hours as needed. 02/10/17   Law, Waylan Boga, PA-C  ofloxacin (FLOXIN) 0.3 % otic solution Place 5 drops into the left ear 2 (two) times daily. 02/28/17   Ivery Quale, PA-C  ondansetron (ZOFRAN) 4 MG tablet Take 1 tablet (4 mg total) by mouth every 6 (six) hours. 02/10/17   Law, Waylan Boga, PA-C  predniSONE (DELTASONE) 10 MG tablet Take 6 tablets day one, 5 tablets day two, 4 tablets day three, 3 tablets day four, 2 tablets day five, then 1 tablet day six 05/28/17   Pauline Aus, PA-C    Family History History reviewed. No pertinent family history.  Social History Social History  Substance Use Topics  . Smoking status: Former Smoker    Packs/day: 0.50    Types: Cigarettes  . Smokeless tobacco: Never Used  .  Alcohol use No     Allergies   Patient has no known allergies.   Review of Systems Review of Systems  Constitutional: Negative for chills, diaphoresis and fever.  HENT: Positive for congestion, postnasal drip, rhinorrhea, sinus pain, sinus pressure and sore throat. Negative for ear pain.   Respiratory: Positive for cough and chest tightness. Negative for shortness of breath, wheezing and stridor.   Cardiovascular: Negative for chest pain.  Musculoskeletal: Negative for neck pain and neck stiffness.     Physical Exam Updated Vital Signs BP 101/71 (BP Location: Right Arm)   Pulse 76   Temp 98.8 F (37.1 C) (Oral)   Resp 18   Ht 5' (1.524 m)   Wt 52.2 kg (115 lb)   LMP 05/28/2017   SpO2 100%   BMI 22.46 kg/m   Physical Exam  Constitutional: Vital signs are normal. She appears well-developed and well-nourished.  HENT:  Head: Normocephalic and atraumatic.  Right Ear: Tympanic membrane, external ear and ear canal normal.  Left Ear: Tympanic membrane, external ear and ear canal normal.  Nose: Mucosal edema and rhinorrhea present. Right sinus exhibits maxillary sinus tenderness and frontal sinus tenderness. Left sinus exhibits maxillary sinus tenderness and frontal sinus tenderness.  Mouth/Throat: Uvula is midline, oropharynx is clear and moist and mucous membranes are normal. No uvula swelling. No posterior oropharyngeal  edema, posterior oropharyngeal erythema or tonsillar abscesses. No tonsillar exudate.  Neck: Neck supple.  Cardiovascular: Normal rate and regular rhythm.   Pulmonary/Chest: Effort normal and breath sounds normal. No accessory muscle usage. No respiratory distress. She has no decreased breath sounds. She has no wheezes. She has no rales.  Mild TTP of costochondral joints bilaterally.  Palpation recreates and exacerbates her chest pain     Lymphadenopathy:    She has no cervical adenopathy.  Skin: She is not diaphoretic.  Nursing note and vitals  reviewed.    ED Treatments / Results  Labs (all labs ordered are listed, but only abnormal results are displayed) Labs Reviewed - No data to display  EKG  EKG Interpretation None       Radiology Dg Chest 2 View  Result Date: 06/25/2017 CLINICAL DATA:  Nonproductive cough, mid chest pain for 1 week EXAM: CHEST  2 VIEW COMPARISON:  None. FINDINGS: No active infiltrate or effusion is seen. Mediastinal and hilar contours are unremarkable. There is mild peribronchial thickening which may indicate bronchitis. The heart is within normal limits in size. No bony abnormality is seen. IMPRESSION: No active lung disease. Peribronchial thickening may indicate bronchitis. Electronically Signed   By: Dwyane DeePaul  Barry M.D.   On: 06/25/2017 11:42    Procedures Procedures (including critical care time)  Medications Ordered in ED Medications - No data to display   Initial Impression / Assessment and Plan / ED Course  I have reviewed the triage vital signs and the nursing notes.  Pertinent labs & imaging results that were available during my care of the patient were reviewed by me and considered in my medical decision making (see chart for details).    Pt CXR negative for acute infiltrate. Patients symptoms are consistent with URI, likely viral etiology. Discussed that antibiotics are not indicated for viral infections. Pt will be discharged with symptomatic treatment and rx for Tessalon pearls.  She was given instructions on OTC pain medication. Exam also consistent with seasonal allergies, suggested a second generation antihistamine to help her symptoms.   Verbalizes understanding and is agreeable with plan. Pt is hemodynamically stable & in NAD prior to dc.   Final Clinical Impressions(s) / ED Diagnoses   Final diagnoses:  Cough    New Prescriptions Discharge Medication List as of 06/25/2017 12:04 PM    START taking these medications   Details  benzonatate (TESSALON) 100 MG capsule Take 1  capsule (100 mg total) by mouth every 8 (eight) hours., Starting Tue 06/25/2017, Print         Cristina GongHammond, Kaulin Chaves W, PA-C 06/26/17 2059    Marily MemosMesner, Jason, MD 06/27/17 2152

## 2017-08-19 ENCOUNTER — Encounter (HOSPITAL_COMMUNITY): Payer: Self-pay | Admitting: Emergency Medicine

## 2017-08-19 ENCOUNTER — Emergency Department (HOSPITAL_COMMUNITY)
Admission: EM | Admit: 2017-08-19 | Discharge: 2017-08-20 | Disposition: A | Discharge status: Home / Self Care | Payer: Medicaid Other | Attending: Emergency Medicine | Admitting: Emergency Medicine

## 2017-08-19 DIAGNOSIS — R1013 Epigastric pain: Secondary | ICD-10-CM | POA: Diagnosis not present

## 2017-08-19 DIAGNOSIS — Z87891 Personal history of nicotine dependence: Secondary | ICD-10-CM | POA: Insufficient documentation

## 2017-08-19 DIAGNOSIS — R42 Dizziness and giddiness: Secondary | ICD-10-CM

## 2017-08-19 DIAGNOSIS — H81399 Other peripheral vertigo, unspecified ear: Secondary | ICD-10-CM | POA: Insufficient documentation

## 2017-08-19 LAB — HEPATIC FUNCTION PANEL
ALBUMIN: 4.8 g/dL (ref 3.5–5.0)
ALT: 14 U/L (ref 14–54)
AST: 19 U/L (ref 15–41)
Alkaline Phosphatase: 64 U/L (ref 38–126)
BILIRUBIN TOTAL: 0.5 mg/dL (ref 0.3–1.2)
Bilirubin, Direct: 0.1 mg/dL (ref 0.1–0.5)
Indirect Bilirubin: 0.4 mg/dL (ref 0.3–0.9)
TOTAL PROTEIN: 7.8 g/dL (ref 6.5–8.1)

## 2017-08-19 LAB — URINALYSIS, ROUTINE W REFLEX MICROSCOPIC
BILIRUBIN URINE: NEGATIVE
Glucose, UA: NEGATIVE mg/dL
HGB URINE DIPSTICK: NEGATIVE
Ketones, ur: NEGATIVE mg/dL
NITRITE: NEGATIVE
PROTEIN: NEGATIVE mg/dL
Specific Gravity, Urine: 1.023 (ref 1.005–1.030)
pH: 7 (ref 5.0–8.0)

## 2017-08-19 LAB — CBC WITH DIFFERENTIAL/PLATELET
BASOS ABS: 0 10*3/uL (ref 0.0–0.1)
BASOS PCT: 0 %
EOS ABS: 0.3 10*3/uL (ref 0.0–0.7)
EOS PCT: 5 %
HCT: 40.7 % (ref 36.0–46.0)
Hemoglobin: 14 g/dL (ref 12.0–15.0)
Lymphocytes Relative: 27 %
Lymphs Abs: 1.8 10*3/uL (ref 0.7–4.0)
MCH: 31 pg (ref 26.0–34.0)
MCHC: 34.4 g/dL (ref 30.0–36.0)
MCV: 90 fL (ref 78.0–100.0)
MONO ABS: 0.5 10*3/uL (ref 0.1–1.0)
Monocytes Relative: 7 %
Neutro Abs: 4 10*3/uL (ref 1.7–7.7)
Neutrophils Relative %: 61 %
PLATELETS: 214 10*3/uL (ref 150–400)
RBC: 4.52 MIL/uL (ref 3.87–5.11)
RDW: 12.3 % (ref 11.5–15.5)
WBC: 6.7 10*3/uL (ref 4.0–10.5)

## 2017-08-19 LAB — BASIC METABOLIC PANEL
ANION GAP: 6 (ref 5–15)
BUN: 10 mg/dL (ref 6–20)
CALCIUM: 9 mg/dL (ref 8.9–10.3)
CO2: 27 mmol/L (ref 22–32)
Chloride: 105 mmol/L (ref 101–111)
Creatinine, Ser: 0.65 mg/dL (ref 0.44–1.00)
GLUCOSE: 101 mg/dL — AB (ref 65–99)
POTASSIUM: 4.4 mmol/L (ref 3.5–5.1)
SODIUM: 138 mmol/L (ref 135–145)

## 2017-08-19 LAB — PREGNANCY, URINE: PREG TEST UR: NEGATIVE

## 2017-08-19 LAB — LIPASE, BLOOD: Lipase: 30 U/L (ref 11–51)

## 2017-08-19 MED ORDER — ONDANSETRON HCL 4 MG/2ML IJ SOLN: 4.0000 mg | Freq: Once | INTRAMUSCULAR | Status: DC

## 2017-08-19 MED ORDER — PROMETHAZINE HCL 25 MG/ML IJ SOLN
12.5000 mg | Freq: Once | INTRAMUSCULAR | Status: AC
  Administered 2017-08-19: 12.5 mg via INTRAVENOUS
  Filled 2017-08-19: qty 1

## 2017-08-19 MED ORDER — MECLIZINE HCL 25 MG PO TABS: 25.0000 mg | ORAL_TABLET | Freq: Three times a day (TID) | ORAL | 0 refills | Status: DC | PRN

## 2017-08-19 MED ORDER — MECLIZINE HCL 12.5 MG PO TABS
25.0000 mg | ORAL_TABLET | Freq: Once | ORAL | Status: AC
  Administered 2017-08-19: 25 mg via ORAL
  Filled 2017-08-19: qty 2

## 2017-08-19 MED ORDER — METOCLOPRAMIDE HCL 5 MG/ML IJ SOLN
10.0000 mg | Freq: Once | INTRAMUSCULAR | Status: AC
  Administered 2017-08-20: 10 mg via INTRAVENOUS
  Filled 2017-08-19: qty 2

## 2017-08-19 MED ORDER — SODIUM CHLORIDE 0.9 % IV BOLUS (SEPSIS)
1000.0000 mL | Freq: Once | INTRAVENOUS | Status: AC
  Administered 2017-08-19: 1000 mL via INTRAVENOUS

## 2017-08-19 MED ORDER — METOCLOPRAMIDE HCL 10 MG PO TABS: 10.0000 mg | ORAL_TABLET | Freq: Four times a day (QID) | ORAL | 0 refills | Status: DC

## 2017-08-19 NOTE — ED Triage Notes (Signed)
Patient complaining of dizziness and nausea constant since yesterday.

## 2017-08-19 NOTE — ED Provider Notes (Signed)
[facility] Provider Note   CSN: [id number] Arrival date & time: 08/19/17  1702     History   Chief Complaint Chief Complaint  Patient presents with  . Dizziness    HPI Ashley Navarro is a 21 y.o. female.  HPI  patient presents to the emergency room for evaluation of dizziness and nausea that started yesterday. Patient is having a room spinning sensation. It gets worse when she moves around. She becomes nauseated when that happens. She denies any recent head trauma. She denies any earache. No difficulty with her speech or coordination. The patient tried taking Zofran for her nausea but it did not help.  Patient also is having some pain in her upper abdomen. It is sharp. She denies any diarrhea or dysuria. No fevers or chills. History reviewed. No pertinent past medical history.  There are no active problems to display for this patient.   History reviewed. No pertinent surgical history.  OB History    No data available       Home Medications    Prior to Admission medications   Medication Sig Start Date End Date Taking? Authorizing Provider  ibuprofen (ADVIL,MOTRIN) 600 MG tablet Take 1 tablet (600 mg total) by mouth every 6 (six) hours as needed. 02/10/17  Yes Law, Waylan Boga, PA-C  naproxen sodium (ANAPROX) 220 MG tablet Take 220-440 mg by mouth daily as needed.   Yes [provider]  meclizine (ANTIVERT) 25 MG tablet Take 1 tablet (25 mg total) by mouth 3 (three) times daily as needed for dizziness. 08/19/17   Linwood Dibbles, MD  metoCLOPramide (REGLAN) 10 MG tablet Take 1 tablet (10 mg total) by mouth every 6 (six) hours. 08/19/17   Linwood Dibbles, MD    Family History History reviewed. No pertinent family history.  Social History Social History  Substance Use Topics  . Smoking status: Former Smoker    Packs/day: 0.50    Types: Cigarettes  . Smokeless tobacco: Never Used  . Alcohol use No     Allergies   Patient has no known  allergies.   Review of Systems Review of Systems  Gastrointestinal: Positive for abdominal pain (upper abdominal discomfort).  Genitourinary: Negative for dysuria, hematuria, urgency and vaginal discharge.  All other systems reviewed and are negative.    Physical Exam Updated Vital Signs BP 116/72   Pulse 65   Temp 98.6 F (37 C) (Oral)   Resp 18   Ht 1.549 m (5\' 1" )   Wt 50.3 kg (111 lb)   LMP 08/15/2017   SpO2 100%   BMI 20.97 kg/m   Physical Exam  Constitutional: She appears well-developed and well-nourished. No distress.  HENT:  Head: Normocephalic and atraumatic.  Right Ear: External ear normal.  Left Ear: External ear normal.  Eyes: Conjunctivae are normal. Right eye exhibits no discharge. Left eye exhibits no discharge. No scleral icterus.  Neck: Neck supple. No tracheal deviation present.  Cardiovascular: Normal rate, regular rhythm and intact distal pulses.   Pulmonary/Chest: Effort normal and breath sounds normal. No stridor. No respiratory distress. She has no wheezes. She has no rales.  Abdominal: Soft. Bowel sounds are normal. She exhibits no distension. There is tenderness in the epigastric area. There is no rigidity, no rebound and no guarding. No hernia.  Musculoskeletal: She exhibits no edema or tenderness.  Neurological: She is alert. She has normal strength. No cranial nerve deficit (no facial droop, extraocular movements intact, no slurred speech) or sensory deficit. She exhibits normal  muscle tone. She displays no seizure activity. Coordination normal.  Skin: Skin is warm and dry. No rash noted.  Psychiatric: She has a normal mood and affect.  Nursing note and vitals reviewed.    ED Treatments / Results  Labs (all labs ordered are listed, but only abnormal results are displayed) Labs Reviewed  BASIC METABOLIC PANEL - Abnormal; Notable for the following:       Result Value   Glucose, Bld 101 (*)    All other components within normal limits   URINALYSIS, ROUTINE W REFLEX MICROSCOPIC - Abnormal; Notable for the following:    Leukocytes, UA TRACE (*)    Bacteria, UA RARE (*)    Squamous Epithelial / LPF 0-5 (*)    All other components within normal limits  CBC WITH DIFFERENTIAL/PLATELET  PREGNANCY, URINE  HEPATIC FUNCTION PANEL  LIPASE, BLOOD     Radiology No results found.  Procedures Procedures (including critical care time)  Medications Ordered in ED Medications  metoCLOPramide (REGLAN) injection 10 mg (not administered)  meclizine (ANTIVERT) tablet 25 mg (25 mg Oral Given 08/19/17 2242)  sodium chloride 0.9 % bolus 1,000 mL (1,000 mLs Intravenous New Bag/Given 08/19/17 2253)  promethazine (PHENERGAN) injection 12.5 mg (12.5 mg Intravenous Given 08/19/17 2243)     Initial Impression / Assessment and Plan / ED Course  I have reviewed the triage vital signs and the nursing notes.  Pertinent labs & imaging results that were available during my care of the patient were reviewed by me and considered in my medical decision making (see chart for details).    Patient presented to the emergency room with complaints of dizziness and mild upper abdominal discomfort. Her symptoms sound vertiginous in nature. It is positional and she has a sensation of room spinning. Patient has no neurologic findings to suggest a central cause. She was treated with medications for nausea and meclizine. Discussed doing Epley maneuvers at home.  Follow up with primary care doctor if symptoms are not improving  Urinalysis does show trace leukocyte esterase and 6-30 white blood cells in the urine. Patient however denies any urinary symptoms whatsoever.  I will hold off on treatment and sent off a urine culture.  Final Clinical Impressions(s) / ED Diagnoses   Final diagnoses:  Dizziness  Peripheral vertigo, unspecified laterality    New Prescriptions New Prescriptions   MECLIZINE (ANTIVERT) 25 MG TABLET    Take 1 tablet (25 mg total) by mouth  3 (three) times daily as needed for dizziness.   METOCLOPRAMIDE (REGLAN) 10 MG TABLET    Take 1 tablet (10 mg total) by mouth every 6 (six) hours.     Linwood Dibbles, MD 08/19/17 2348

## 2017-08-19 NOTE — Discharge Instructions (Signed)
Take medications as needed for nausea and the dizziness, try doing the Epley maneuvers. Follow-up with a primary care doctor for further evaluation if you're not improving within the next week

## 2017-08-21 LAB — URINE CULTURE: Culture: <10000 — AB

## 2018-01-14 ENCOUNTER — Emergency Department (HOSPITAL_COMMUNITY): Payer: Self-pay

## 2018-01-14 ENCOUNTER — Encounter (HOSPITAL_COMMUNITY): Payer: Self-pay

## 2018-01-14 ENCOUNTER — Emergency Department (HOSPITAL_COMMUNITY)
Admission: EM | Admit: 2018-01-14 | Discharge: 2018-01-14 | Disposition: A | Discharge status: Home / Self Care | Payer: Self-pay | Attending: Emergency Medicine | Admitting: Emergency Medicine

## 2018-01-14 ENCOUNTER — Other Ambulatory Visit: Payer: Self-pay

## 2018-01-14 DIAGNOSIS — Z5321 Procedure and treatment not carried out due to patient leaving prior to being seen by health care provider: Secondary | ICD-10-CM | POA: Insufficient documentation

## 2018-01-14 DIAGNOSIS — R079 Chest pain, unspecified: Secondary | ICD-10-CM | POA: Insufficient documentation

## 2018-01-14 NOTE — ED Notes (Signed)
Registration called to inform pt had left ED. Pt was ambulatory and seen leaving with friend.
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
## 2018-11-23 ENCOUNTER — Emergency Department (HOSPITAL_COMMUNITY): Payer: Self-pay

## 2018-11-23 ENCOUNTER — Encounter (HOSPITAL_COMMUNITY): Payer: Self-pay | Admitting: Emergency Medicine

## 2018-11-23 ENCOUNTER — Other Ambulatory Visit: Payer: Self-pay

## 2018-11-23 ENCOUNTER — Emergency Department (HOSPITAL_COMMUNITY)
Admission: EM | Admit: 2018-11-23 | Discharge: 2018-11-23 | Disposition: A | Discharge status: Home / Self Care | Payer: Self-pay | Attending: Emergency Medicine | Admitting: Emergency Medicine

## 2018-11-23 DIAGNOSIS — R1031 Right lower quadrant pain: Secondary | ICD-10-CM | POA: Insufficient documentation

## 2018-11-23 DIAGNOSIS — Z79899 Other long term (current) drug therapy: Secondary | ICD-10-CM | POA: Insufficient documentation

## 2018-11-23 LAB — COMPREHENSIVE METABOLIC PANEL
ALT: 14 U/L (ref 0–44)
ANION GAP: 8 (ref 5–15)
AST: 18 U/L (ref 15–41)
Albumin: 5 g/dL (ref 3.5–5.0)
Alkaline Phosphatase: 53 U/L (ref 38–126)
BUN: 6 mg/dL (ref 6–20)
CHLORIDE: 107 mmol/L (ref 98–111)
CO2: 25 mmol/L (ref 22–32)
CREATININE: 0.61 mg/dL (ref 0.44–1.00)
Calcium: 9.2 mg/dL (ref 8.9–10.3)
GFR calc non Af Amer: >60 mL/min (ref >60)
Glucose, Bld: 92 mg/dL (ref 70–99)
POTASSIUM: 3.4 mmol/L — AB (ref 3.5–5.1)
SODIUM: 140 mmol/L (ref 135–145)
Total Bilirubin: 0.8 mg/dL (ref 0.3–1.2)
Total Protein: 8.3 g/dL — ABNORMAL HIGH (ref 6.5–8.1)

## 2018-11-23 LAB — CBC
HCT: 42.6 % (ref 36.0–46.0)
HEMOGLOBIN: 14.2 g/dL (ref 12.0–15.0)
MCH: 29.8 pg (ref 26.0–34.0)
MCHC: 33.3 g/dL (ref 30.0–36.0)
MCV: 89.3 fL (ref 80.0–100.0)
NRBC: 0 % (ref 0.0–0.2)
Platelets: 209 10*3/uL (ref 150–400)
RBC: 4.77 MIL/uL (ref 3.87–5.11)
RDW: 11.9 % (ref 11.5–15.5)
WBC: 4.7 10*3/uL (ref 4.0–10.5)

## 2018-11-23 LAB — URINALYSIS, ROUTINE W REFLEX MICROSCOPIC
BACTERIA UA: NONE SEEN
Bilirubin Urine: NEGATIVE
Glucose, UA: NEGATIVE mg/dL
Hgb urine dipstick: NEGATIVE
KETONES UR: NEGATIVE mg/dL
NITRITE: NEGATIVE
PROTEIN: NEGATIVE mg/dL
Specific Gravity, Urine: 1.005 (ref 1.005–1.030)
pH: 6 (ref 5.0–8.0)

## 2018-11-23 LAB — I-STAT BETA HCG BLOOD, ED (MC, WL, AP ONLY): I-stat hCG, quantitative: <5 m[IU]/mL (ref <5)

## 2018-11-23 LAB — LIPASE, BLOOD: LIPASE: 38 U/L (ref 11–51)

## 2018-11-23 MED ORDER — TRAMADOL HCL 50 MG PO TABS: 50.0000 mg | ORAL_TABLET | Freq: Four times a day (QID) | ORAL | 0 refills | Status: DC | PRN

## 2018-11-23 MED ORDER — ONDANSETRON HCL 4 MG/2ML IJ SOLN
4.0000 mg | Freq: Once | INTRAMUSCULAR | Status: AC | PRN
  Administered 2018-11-23: 4 mg via INTRAVENOUS
  Filled 2018-11-23: qty 2

## 2018-11-23 MED ORDER — KETOROLAC TROMETHAMINE 30 MG/ML IJ SOLN
30.0000 mg | Freq: Once | INTRAMUSCULAR | Status: AC
  Administered 2018-11-23: 30 mg via INTRAVENOUS
  Filled 2018-11-23: qty 1

## 2018-11-23 MED ORDER — MORPHINE SULFATE (PF) 4 MG/ML IV SOLN
4.0000 mg | Freq: Once | INTRAVENOUS | Status: AC
  Administered 2018-11-23: 4 mg via INTRAVENOUS
  Filled 2018-11-23: qty 1

## 2018-11-23 MED ORDER — ONDANSETRON 4 MG PO TBDP: ORAL_TABLET | ORAL | 0 refills | Status: DC

## 2018-11-23 MED ORDER — IOPAMIDOL (ISOVUE-300) INJECTION 61%
100.0000 mL | Freq: Once | INTRAVENOUS | Status: AC | PRN
  Administered 2018-11-23: 100 mL via INTRAVENOUS

## 2018-11-23 MED ORDER — IOPAMIDOL (ISOVUE-300) INJECTION 61%
30.0000 mL | Freq: Once | INTRAVENOUS | Status: AC | PRN
  Administered 2018-11-23: 30 mL via INTRAVENOUS

## 2018-11-23 MED ORDER — HYDROCODONE-ACETAMINOPHEN 5-325 MG PO TABS: 1.0000 | ORAL_TABLET | Freq: Four times a day (QID) | ORAL | 0 refills | Status: DC | PRN

## 2018-11-23 NOTE — ED Triage Notes (Signed)
Patient c/o right upper abd pain with nausea and vomiting. Patient states started having some nausea 3 days ago and had "a little pain" Friday night after drinking one alcoholic beverage. Patient states Friday night vomited x3 large amount and had a small amount of diarrhea. Patient states today while eating sharp severe pain in right upper abd that has eased but is now constant. Denies any fevers or urinary symptoms.

## 2018-11-23 NOTE — Discharge Instructions (Addendum)
Follow-up with Dr. Despina HiddenEure in 1 to 2 weeks.  Get seen sooner if problems

## 2018-11-23 NOTE — ED Provider Notes (Signed)
[facility] EMERGENCY DEPARTMENT Provider Note   CSN: [id number] Arrival date & time: 11/23/18  1317     History   Chief Complaint Chief Complaint  Patient presents with  . Abdominal Pain    HPI Ashley Navarro is a 22 y.o. female.  She presents with right-sided abdominal pain that is been going on since Friday.  Has been associated with some nausea.  She tried vomiting and it brought her a little bit of relief.  The pain is recurred again today and does not go away.  She rates it as 12 out of 10.  It sharp in nature.  Its associated with nausea.  She had a small bowel movement today.  She usually runs a little constipated.  No fevers no chills no chest pain no shortness of breath.  No prior surgical history.  No sick contacts or recent travel.  The history is provided by the patient.  Abdominal Pain   This is a new problem. The current episode started more than 2 days ago. The problem occurs constantly. The problem has been gradually worsening. The pain is associated with an unknown factor. The pain is located in the RUQ. The quality of the pain is sharp. The pain is at a severity of 10/10. Associated symptoms include diarrhea, nausea, vomiting and constipation. Pertinent negatives include fever, melena, dysuria, frequency, hematuria, headaches, arthralgias and myalgias. Nothing aggravates the symptoms. The symptoms are relieved by vomiting. Past workup includes CT scan.    History reviewed. No pertinent past medical history.  There are no active problems to display for this patient.   Past Surgical History:  Procedure Laterality Date  . TYMPANOSTOMY TUBE PLACEMENT       OB History    Gravida  0   Para  0   Term  0   Preterm  0   AB  0   Living  0     SAB  0   TAB  0   Ectopic  0   Multiple  0   Live Births  0            Home Medications    Prior to Admission medications   Medication Sig Start Date End Date Taking? Authorizing Provider  ibuprofen  (ADVIL,MOTRIN) 600 MG tablet Take 1 tablet (600 mg total) by mouth every 6 (six) hours as needed. 02/10/17  Yes Law, Waylan Boga, PA-C  meclizine (ANTIVERT) 25 MG tablet Take 1 tablet (25 mg total) by mouth 3 (three) times daily as needed for dizziness. 08/19/17  Yes Linwood Dibbles, MD  metoCLOPramide (REGLAN) 10 MG tablet Take 1 tablet (10 mg total) by mouth every 6 (six) hours. 08/19/17  Yes Linwood Dibbles, MD  naproxen sodium (ANAPROX) 220 MG tablet Take 220-440 mg by mouth daily as needed.   Yes [provider]    Family History History reviewed. No pertinent family history.  Social History Social History   Tobacco Use  . Smoking status: Former Smoker    Packs/day: 0.50    Types: Cigarettes  . Smokeless tobacco: Never Used  Substance Use Topics  . Alcohol use: Yes    Comment: occasional  . Drug use: No     Allergies   Patient has no known allergies.   Review of Systems Review of Systems  Constitutional: Negative for fever.  HENT: Negative for sore throat.   Eyes: Negative for visual disturbance.  Respiratory: Negative for shortness of breath.   Cardiovascular: Negative for chest pain.  Gastrointestinal: Positive for abdominal pain, constipation, diarrhea, nausea and vomiting. Negative for melena.  Genitourinary: Negative for dysuria, frequency and hematuria.  Musculoskeletal: Negative for arthralgias and myalgias.  Skin: Negative for rash.  Neurological: Negative for headaches.     Physical Exam Updated Vital Signs BP 126/77 (BP Location: Right Arm)   Pulse 83   Temp 98.7 F (37.1 C) (Oral)   Resp 17   Ht 5\' 1"  (1.549 m)   Wt 53.1 kg   LMP 10/27/2018   SpO2 97%   BMI 22.13 kg/m   Physical Exam  Constitutional: She appears well-developed and well-nourished. No distress.  HENT:  Head: Normocephalic and atraumatic.  Eyes: Conjunctivae are normal.  Neck: Neck supple.  Cardiovascular: Normal rate and regular rhythm.  No murmur heard. Pulmonary/Chest:  Effort normal and breath sounds normal. No respiratory distress.  Abdominal: Soft. There is tenderness in the right upper quadrant. There is no rigidity and no guarding.  Musculoskeletal: She exhibits no edema, tenderness or deformity.  Neurological: She is alert. She has normal strength. GCS eye subscore is 4. GCS verbal subscore is 5. GCS motor subscore is 6.  Skin: Skin is warm and dry.  Psychiatric: She has a normal mood and affect.  Nursing note and vitals reviewed.    ED Treatments / Results  Labs (all labs ordered are listed, but only abnormal results are displayed) Labs Reviewed  COMPREHENSIVE METABOLIC PANEL - Abnormal; Notable for the following components:      Result Value   Potassium 3.4 (*)    Total Protein 8.3 (*)    All other components within normal limits  URINALYSIS, ROUTINE W REFLEX MICROSCOPIC - Abnormal; Notable for the following components:   Color, Urine STRAW (*)    Leukocytes, UA SMALL (*)    All other components within normal limits  LIPASE, BLOOD  CBC  I-STAT BETA HCG BLOOD, ED ([facility], [facility], [facility] ONLY)    EKG None  Radiology No results found.  Procedures Procedures (including critical care time)  Medications Ordered in ED Medications  ondansetron (ZOFRAN) injection 4 mg (4 mg Intravenous Given 11/23/18 1415)  morphine 4 MG/ML injection 4 mg (4 mg Intravenous Given 11/23/18 1415)  ketorolac (TORADOL) 30 MG/ML injection 30 mg (30 mg Intravenous Given 11/23/18 1415)  iopamidol (ISOVUE-300) 61 % injection 30 mL (30 mLs Intravenous Contrast Given 11/23/18 1503)     Initial Impression / Assessment and Plan / ED Course  I have reviewed the triage vital signs and the nursing notes.  Pertinent labs & imaging results that were available during my care of the patient were reviewed by me and considered in my medical decision making (see chart for details).  Clinical Course as of Nov 23 1530  Wynelle Link Nov 23, 2018  1453 Due to the patient's weight the radiology  is asking to give her oral contrast for the CT.   [MB]  1453 Differential diagnosis includes cholelithiasis, constipation, irritable bowel.    [MB]  1454  Her urine is clean her pregnancy test is negative her white count is normal her LFTs are normal.   [MB]  1526 I have signed out the patient to Dr. Estell Harpin with CT abdomen and pelvis pending.  Disposition per results of CT.  Doubt any pelvic pathology by exam and history and do not think she needs a pelvic exam.  She is sexually active but only with a female partner.   [MB]    Clinical Course User Index [MB] Terrilee Files,  MD     Final Clinical Impressions(s) / ED Diagnoses   Final diagnoses:  None    ED Discharge Orders    None       Terrilee FilesButler, Michael C, MD 11/23/18 [phone]

## 2018-11-24 MED FILL — Hydrocodone-Acetaminophen Tab 5-325 MG: ORAL | Qty: 6 | Status: AC

## 2019-10-07 ENCOUNTER — Emergency Department (HOSPITAL_COMMUNITY)
Admission: EM | Admit: 2019-10-07 | Discharge: 2019-10-07 | Disposition: A | Discharge status: Home / Self Care | Payer: Self-pay | Attending: Emergency Medicine | Admitting: Emergency Medicine

## 2019-10-07 ENCOUNTER — Encounter (HOSPITAL_COMMUNITY): Payer: Self-pay | Admitting: Emergency Medicine

## 2019-10-07 ENCOUNTER — Other Ambulatory Visit: Payer: Self-pay

## 2019-10-07 DIAGNOSIS — Z79899 Other long term (current) drug therapy: Secondary | ICD-10-CM | POA: Insufficient documentation

## 2019-10-07 DIAGNOSIS — Z87891 Personal history of nicotine dependence: Secondary | ICD-10-CM | POA: Insufficient documentation

## 2019-10-07 DIAGNOSIS — K0889 Other specified disorders of teeth and supporting structures: Secondary | ICD-10-CM | POA: Insufficient documentation

## 2019-10-07 MED ORDER — AMOXICILLIN 500 MG PO CAPS: 500.0000 mg | ORAL_CAPSULE | Freq: Three times a day (TID) | ORAL | 0 refills | Status: DC

## 2019-10-07 MED ORDER — HYDROCODONE-ACETAMINOPHEN 5-325 MG PO TABS: 1.0000 | ORAL_TABLET | ORAL | 0 refills | Status: DC | PRN

## 2019-10-07 NOTE — Discharge Instructions (Addendum)
See the dentist as scheduled for treatment

## 2019-10-07 NOTE — ED Triage Notes (Signed)
Pt c/o dental pain for a couple of days that got worse today.

## 2019-10-08 NOTE — ED Provider Notes (Signed)
[facility] EMERGENCY DEPARTMENT Provider Note   CSN: [id number] Arrival date & time: 10/07/19  1844     History   Chief Complaint Chief Complaint  Patient presents with  . Dental Pain    HPI Ashley Navarro is a 23 y.o. female.     The history is provided by the patient. No language interpreter was used.  Dental Pain Location:  Lower Lower teeth location:  20/LL 2nd bicuspid Quality:  Aching Severity:  Moderate Timing:  Constant Progression:  Worsening Chronicity:  New Relieved by:  Nothing Worsened by:  Nothing Ineffective treatments:  None tried Associated symptoms: no fever     History reviewed. No pertinent past medical history.  There are no active problems to display for this patient.   Past Surgical History:  Procedure Laterality Date  . TYMPANOSTOMY TUBE PLACEMENT       OB History    Gravida  0   Para  0   Term  0   Preterm  0   AB  0   Living  0     SAB  0   TAB  0   Ectopic  0   Multiple  0   Live Births  0            Home Medications    Prior to Admission medications   Medication Sig Start Date End Date Taking? Authorizing Provider  amoxicillin (AMOXIL) 500 MG capsule Take 1 capsule (500 mg total) by mouth 3 (three) times daily. 10/07/19   Elson Areas, PA-C  HYDROcodone-acetaminophen (NORCO/VICODIN) 5-325 MG tablet Take 1 tablet by mouth every 4 (four) hours as needed. 10/07/19   Elson Areas, PA-C  HYDROcodone-acetaminophen (NORCO/VICODIN) 5-325 MG tablet Take 1 tablet by mouth every 4 (four) hours as needed. 10/07/19   Elson Areas, PA-C  ibuprofen (ADVIL,MOTRIN) 600 MG tablet Take 1 tablet (600 mg total) by mouth every 6 (six) hours as needed. 02/10/17   Law, Waylan Boga, PA-C  meclizine (ANTIVERT) 25 MG tablet Take 1 tablet (25 mg total) by mouth 3 (three) times daily as needed for dizziness. 08/19/17   Linwood Dibbles, MD  metoCLOPramide (REGLAN) 10 MG tablet Take 1 tablet (10 mg total) by mouth every 6 (six) hours.  08/19/17   Linwood Dibbles, MD  naproxen sodium (ANAPROX) 220 MG tablet Take 220-440 mg by mouth daily as needed.    [provider]  ondansetron (ZOFRAN ODT) 4 MG disintegrating tablet 4mg  ODT q4 hours prn nausea/vomit 11/23/18   11/25/18, MD  traMADol (ULTRAM) 50 MG tablet Take 1 tablet (50 mg total) by mouth every 6 (six) hours as needed. 11/23/18   11/25/18, MD    Family History No family history on file.  Social History Social History   Tobacco Use  . Smoking status: Former Smoker    Packs/day: 0.50    Types: Cigarettes  . Smokeless tobacco: Never Used  Substance Use Topics  . Alcohol use: Yes    Comment: occasional  . Drug use: No     Allergies   Patient has no known allergies.   Review of Systems Review of Systems  Constitutional: Negative for fever.  All other systems reviewed and are negative.    Physical Exam Updated Vital Signs BP 112/74 (BP Location: Right Arm)   Pulse 78   Temp 98.4 F (36.9 C) (Oral)   Resp 16   Ht 5\' 5"  (1.651 m)   Wt 46.3 kg   LMP  10/02/2019   SpO2 100%   BMI 16.97 kg/m   Physical Exam Vitals signs and nursing note reviewed.  Constitutional:      Appearance: She is well-developed.  HENT:     Head: Normocephalic.     Nose: Nose normal.     Mouth/Throat:     Mouth: Mucous membranes are moist.     Comments: Swelling around left lower 1st molar,   Eyes:     Pupils: Pupils are equal, round, and reactive to light.  Neck:     Musculoskeletal: Normal range of motion.  Pulmonary:     Effort: Pulmonary effort is normal.  Abdominal:     General: There is no distension.  Musculoskeletal: Normal range of motion.  Neurological:     Mental Status: She is alert and oriented to person, place, and time.  Psychiatric:        Mood and Affect: Mood normal.      ED Treatments / Results  Labs (all labs ordered are listed, but only abnormal results are displayed) Labs Reviewed - No data to display  EKG None   Radiology No results found.  Procedures Procedures (including critical care time)  Medications Ordered in ED Medications - No data to display   Initial Impression / Assessment and Plan / ED Course  I have reviewed the triage vital signs and the nursing notes.  Pertinent labs & imaging results that were available during my care of the patient were reviewed by me and considered in my medical decision making (see chart for details).        MDM  Pt given dental resources   Final Clinical Impressions(s) / ED Diagnoses   Final diagnoses:  Toothache    ED Discharge Orders         Ordered    amoxicillin (AMOXIL) 500 MG capsule  3 times daily     10/07/19 2002    HYDROcodone-acetaminophen (NORCO/VICODIN) 5-325 MG tablet  Every 4 hours PRN     10/07/19 2002    HYDROcodone-acetaminophen (NORCO/VICODIN) 5-325 MG tablet  Every 4 hours PRN     10/07/19 2004        An After Visit Summary was printed and given to the patient.    Sidney Ace 12/45/80 9983    Delora Fuel, MD 38/25/05 0500

## 2020-10-20 IMAGING — CT CT ABD-PELV W/ CM
2 of 4 series · 16 of 46 positions shown, 18 images · IV contrast (Isovue)
Comparison: None.

CLINICAL DATA: Right upper abdominal pain.  Nausea and vomiting.

EXAM:
CT ABDOMEN AND PELVIS WITH CONTRAST
TECHNIQUE: Multidetector CT imaging of the abdomen and pelvis was performed
using the standard protocol following bolus administration of
intravenous contrast.
CONTRAST:  30mL QZ3TVU-1WW IOPAMIDOL (QZ3TVU-1WW) INJECTION 61%,
100mL QZ3TVU-1WW IOPAMIDOL (QZ3TVU-1WW) INJECTION 61%

[Series 2: axial st · axial · 0.62mm/px · z∈[-661,-276]mm · 13 of 85 slices shown, 15 images]
[im 4/85  soft-tissue]
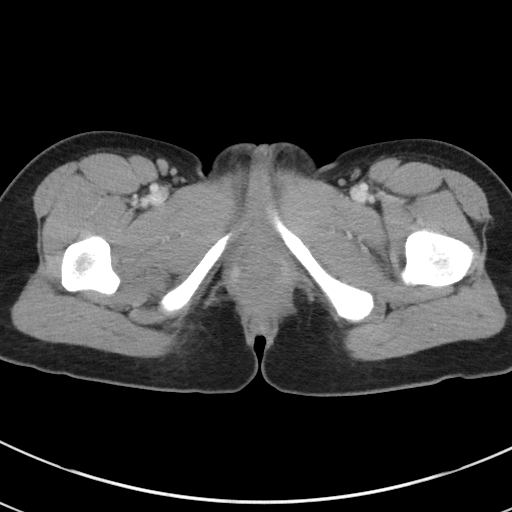
[im 4/85  bone]
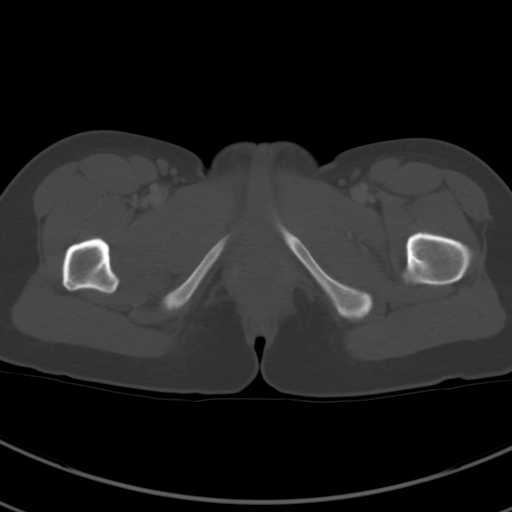
[im 11/85  soft-tissue]
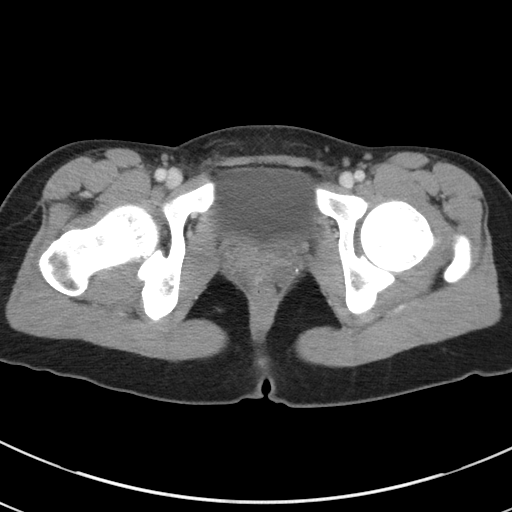
[im 18/85  soft-tissue]
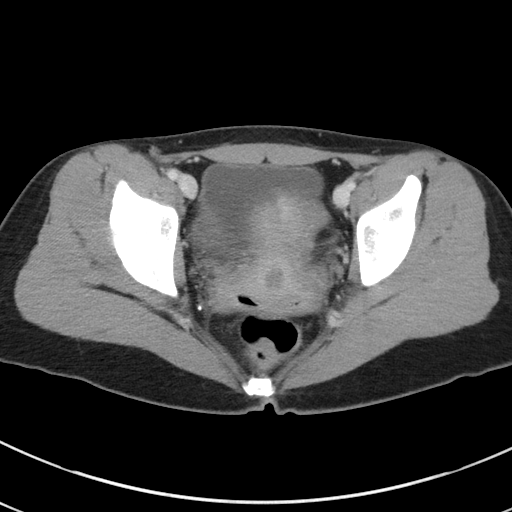
[im 25/85  soft-tissue]
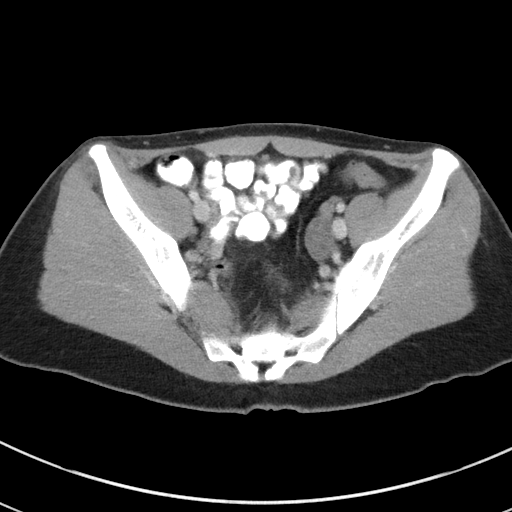
[im 29/85  soft-tissue]
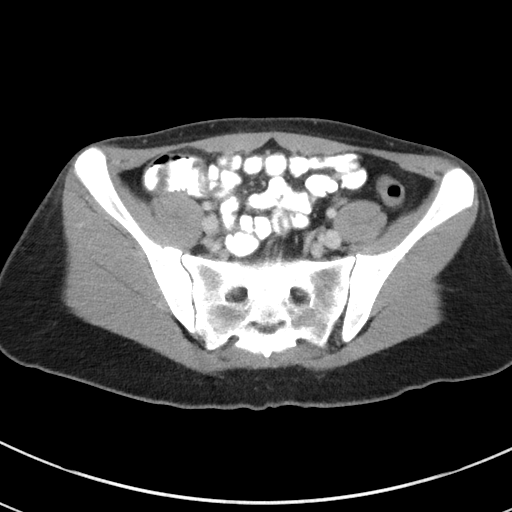
[im 36/85  soft-tissue]
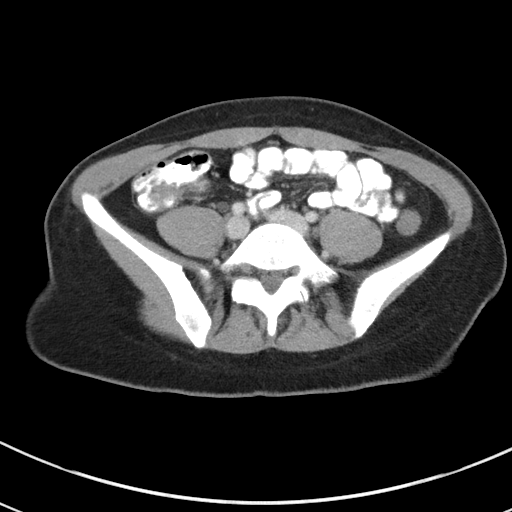
[im 43/85  soft-tissue]
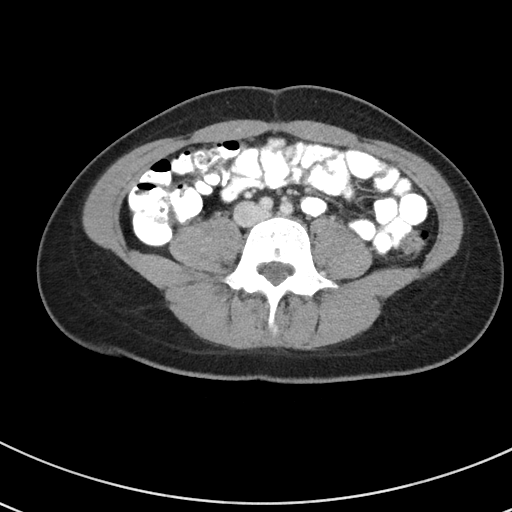
[im 50/85  soft-tissue]
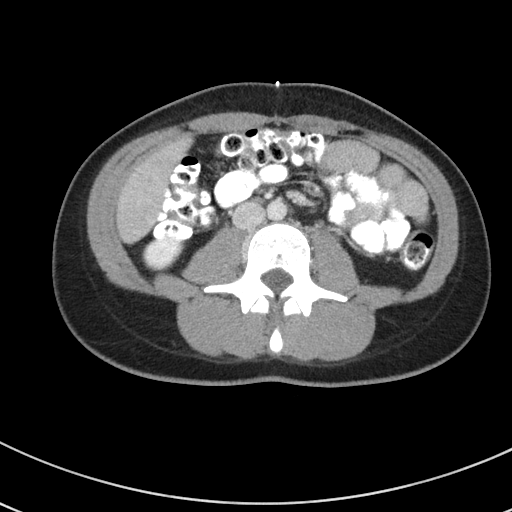
[im 57/85  soft-tissue]
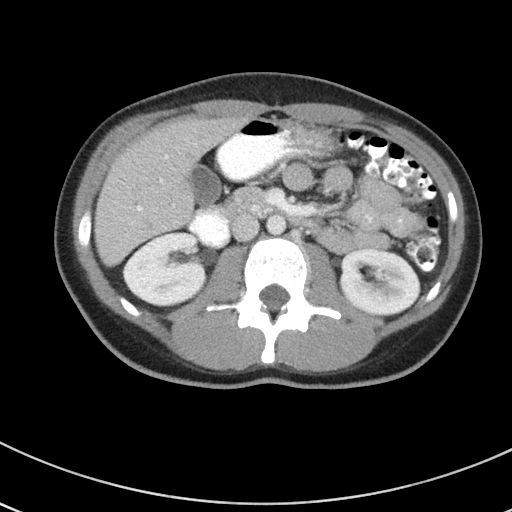
[im 57/85  bone]
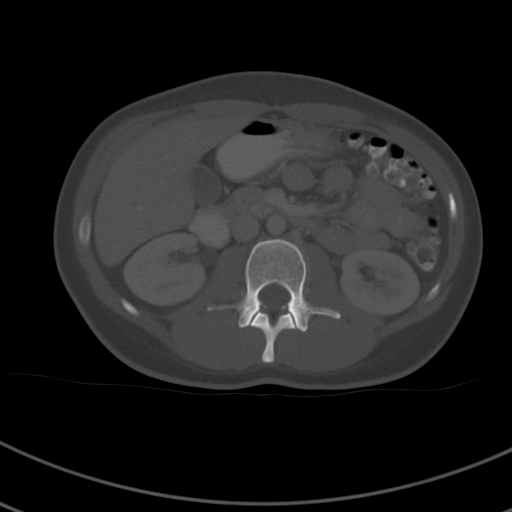
[im 60/85  soft-tissue]
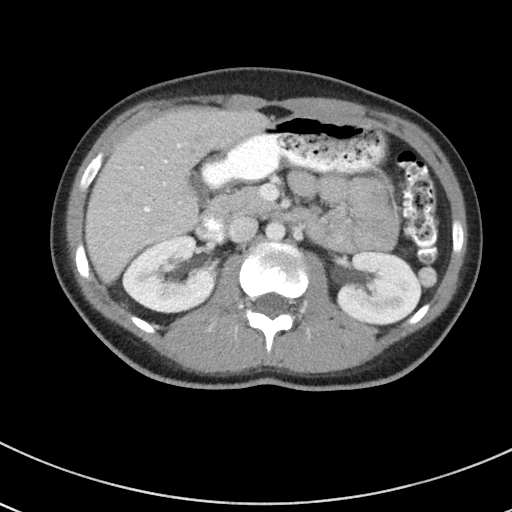
[im 67/85  soft-tissue]
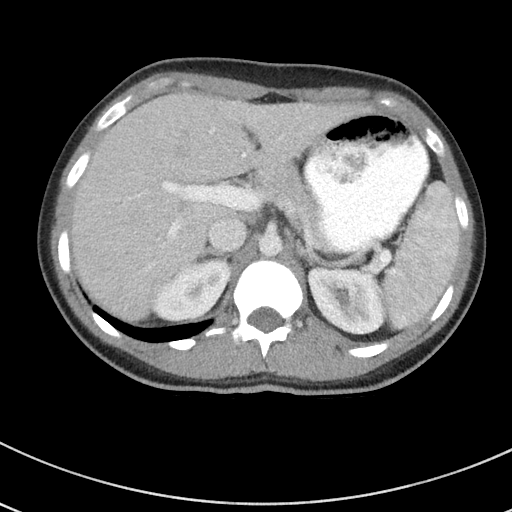
[im 74/85  soft-tissue]
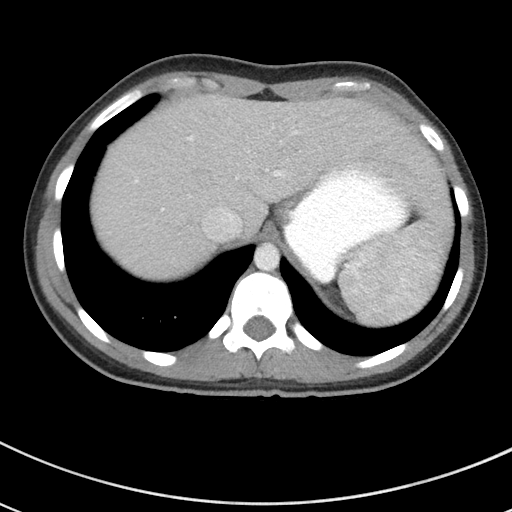
[im 81/85  soft-tissue]
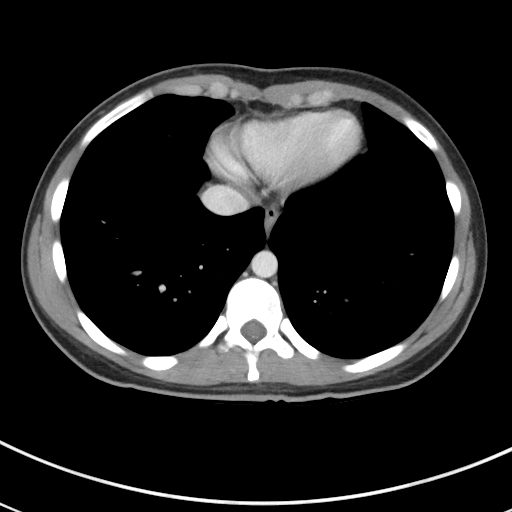

[Series 5: coronal st · coronal · 0.68mm/px · 3 of 66 slices shown]
[im 22/66  soft-tissue]
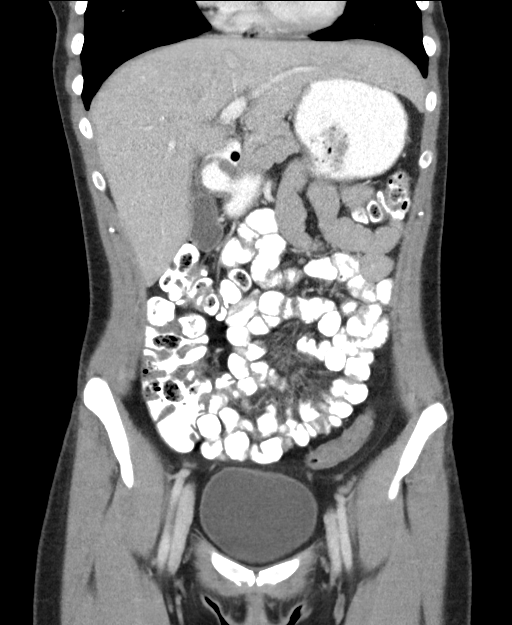
[im 29/66  soft-tissue]
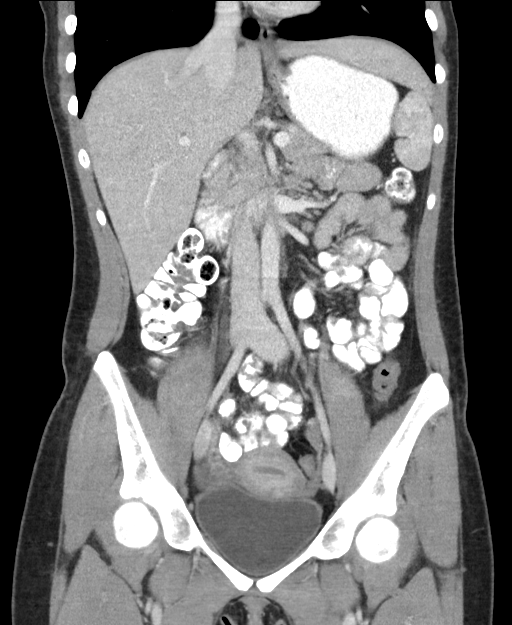
[im 37/66  soft-tissue]
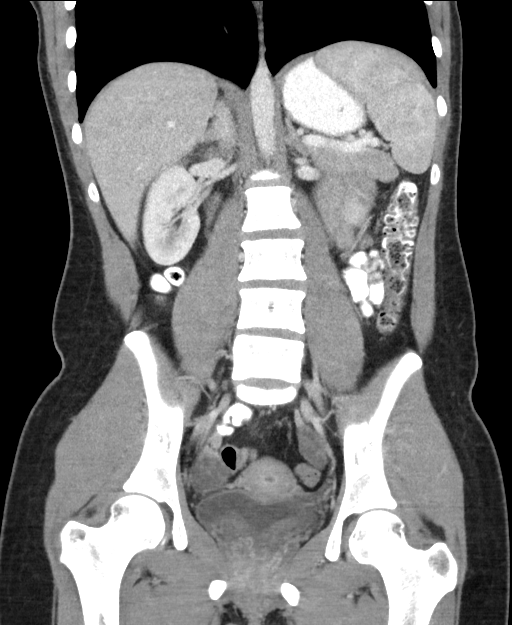

[16 of 46 positions shown; findings below may reference images not displayed]

FINDINGS: Lower chest: No acute abnormality.

Hepatobiliary: No focal liver abnormality is seen. No gallstones,
gallbladder wall thickening, or biliary dilatation.

Pancreas: Unremarkable. No pancreatic ductal dilatation or
surrounding inflammatory changes.

Spleen: Normal in size without focal abnormality.

Adrenals/Urinary Tract: Adrenal glands are unremarkable. Kidneys are
normal, without renal calculi, focal lesion, or hydronephrosis.
Bladder is unremarkable.

Stomach/Bowel: Stomach is within normal limits. Appendix appears
normal. No evidence of bowel wall thickening, distention, or
inflammatory changes.

Vascular/Lymphatic: No significant vascular findings are present. No
enlarged abdominal or pelvic lymph nodes.

Reproductive: Uterus and left ovary are normal. There is a dominant
follicle or follicles in the right ovary. There is also a small
amount of free fluid in the right side of the pelvis, likely
physiologic.

Other: No abdominal wall hernia or abnormality. No abdominopelvic
ascites.

Musculoskeletal: No acute or significant osseous findings.
IMPRESSION: 1. There is a dominant follicle or follicles in the right ovary.
There is a small amount of free fluid in the right side of the
pelvis, likely physiologic, possibly from a ruptured ovarian cyst. A
pelvic ultrasound could better evaluate the uterus and ovaries if
clinically warranted.
2. No other acute abnormalities noted.

## 2020-12-22 ENCOUNTER — Encounter (HOSPITAL_COMMUNITY): Payer: Self-pay | Admitting: Emergency Medicine

## 2020-12-22 ENCOUNTER — Emergency Department (HOSPITAL_COMMUNITY)
Admission: EM | Admit: 2020-12-22 | Discharge: 2020-12-22 | Disposition: A | Discharge status: Home / Self Care | Payer: Self-pay | Attending: Emergency Medicine | Admitting: Emergency Medicine

## 2020-12-22 ENCOUNTER — Other Ambulatory Visit: Payer: Self-pay

## 2020-12-22 ENCOUNTER — Emergency Department (HOSPITAL_COMMUNITY): Payer: Self-pay

## 2020-12-22 DIAGNOSIS — F17210 Nicotine dependence, cigarettes, uncomplicated: Secondary | ICD-10-CM | POA: Insufficient documentation

## 2020-12-22 DIAGNOSIS — R1031 Right lower quadrant pain: Secondary | ICD-10-CM | POA: Insufficient documentation

## 2020-12-22 LAB — URINALYSIS, ROUTINE W REFLEX MICROSCOPIC
Bilirubin Urine: NEGATIVE
Glucose, UA: NEGATIVE mg/dL
Hgb urine dipstick: NEGATIVE
Ketones, ur: NEGATIVE mg/dL
Leukocytes,Ua: NEGATIVE
Nitrite: NEGATIVE
Protein, ur: NEGATIVE mg/dL
Specific Gravity, Urine: 1.025 (ref 1.005–1.030)
pH: 6 (ref 5.0–8.0)

## 2020-12-22 LAB — COMPREHENSIVE METABOLIC PANEL
ALT: 17 U/L (ref 0–44)
AST: 19 U/L (ref 15–41)
Albumin: 4.8 g/dL (ref 3.5–5.0)
Alkaline Phosphatase: 55 U/L (ref 38–126)
Anion gap: 11 (ref 5–15)
BUN: 15 mg/dL (ref 6–20)
CO2: 25 mmol/L (ref 22–32)
Calcium: 8.9 mg/dL (ref 8.9–10.3)
Chloride: 103 mmol/L (ref 98–111)
Creatinine, Ser: 0.65 mg/dL (ref 0.44–1.00)
GFR, Estimated: >60 mL/min (ref >60)
Glucose, Bld: 96 mg/dL (ref 70–99)
Potassium: 3.5 mmol/L (ref 3.5–5.1)
Sodium: 139 mmol/L (ref 135–145)
Total Bilirubin: 0.8 mg/dL (ref 0.3–1.2)
Total Protein: 7.9 g/dL (ref 6.5–8.1)

## 2020-12-22 LAB — CBC WITH DIFFERENTIAL/PLATELET
Abs Immature Granulocytes: 0.02 10*3/uL (ref 0.00–0.07)
Basophils Absolute: 0 10*3/uL (ref 0.0–0.1)
Basophils Relative: 0 %
Eosinophils Absolute: 0.1 10*3/uL (ref 0.0–0.5)
Eosinophils Relative: 2 %
HCT: 46 % (ref 36.0–46.0)
Hemoglobin: 15.2 g/dL — ABNORMAL HIGH (ref 12.0–15.0)
Immature Granulocytes: 0 %
Lymphocytes Relative: 11 %
Lymphs Abs: 0.7 10*3/uL (ref 0.7–4.0)
MCH: 31.2 pg (ref 26.0–34.0)
MCHC: 33 g/dL (ref 30.0–36.0)
MCV: 94.5 fL (ref 80.0–100.0)
Monocytes Absolute: 0.4 10*3/uL (ref 0.1–1.0)
Monocytes Relative: 6 %
Neutro Abs: 5 10*3/uL (ref 1.7–7.7)
Neutrophils Relative %: 81 %
Platelets: 206 10*3/uL (ref 150–400)
RBC: 4.87 MIL/uL (ref 3.87–5.11)
RDW: 12.1 % (ref 11.5–15.5)
WBC: 6.2 10*3/uL (ref 4.0–10.5)
nRBC: 0 % (ref 0.0–0.2)

## 2020-12-22 LAB — WET PREP, GENITAL
Clue Cells Wet Prep HPF POC: NONE SEEN
Sperm: NONE SEEN
Trich, Wet Prep: NONE SEEN
Yeast Wet Prep HPF POC: NONE SEEN

## 2020-12-22 LAB — LIPASE, BLOOD: Lipase: 23 U/L (ref 11–51)

## 2020-12-22 LAB — PREGNANCY, URINE: Preg Test, Ur: NEGATIVE

## 2020-12-22 MED ORDER — PROMETHAZINE HCL 25 MG PO TABS: 25.0000 mg | ORAL_TABLET | Freq: Four times a day (QID) | ORAL | 0 refills | Status: DC | PRN

## 2020-12-22 MED ORDER — MORPHINE SULFATE (PF) 4 MG/ML IV SOLN
4.0000 mg | Freq: Once | INTRAVENOUS | Status: AC
  Administered 2020-12-22: 18:00:00 4 mg via INTRAVENOUS
  Filled 2020-12-22: qty 1

## 2020-12-22 MED ORDER — IOHEXOL 300 MG/ML  SOLN
100.0000 mL | Freq: Once | INTRAMUSCULAR | Status: AC | PRN
  Administered 2020-12-22: 100 mL via INTRAVENOUS

## 2020-12-22 MED ORDER — ONDANSETRON HCL 4 MG/2ML IJ SOLN
4.0000 mg | Freq: Once | INTRAMUSCULAR | Status: AC
  Administered 2020-12-22: 17:00:00 4 mg via INTRAVENOUS
  Filled 2020-12-22: qty 2

## 2020-12-22 NOTE — Discharge Instructions (Signed)
You have been evaluated for your abdominal pain.  Fortunately your CT scan did not show any concerning finding, no evidence of appendicitis.  Your symptoms may be due to marijuana use causing abdominal cramping along with nausea and vomiting.  Take Phenergan as needed for nausea.  Avoid marijuana use.  A gonorrhea and chlamydia test have been obtained, check MyChart, link below,  in the next 1 to 2 days for results.

## 2020-12-22 NOTE — ED Triage Notes (Signed)
Pt c/o abdominal pain with nausea x2 days.

## 2020-12-22 NOTE — ED Provider Notes (Signed)
[facility] EMERGENCY DEPARTMENT Provider Note   CSN: [id number] Arrival date & time: 12/22/20  1637     History Chief Complaint  Patient presents with  . Abdominal Pain    Ashley Navarro is a 24 y.o. female.  The history is provided by the patient. No language interpreter was used.  Abdominal Pain    24 year old female presenting for evaluation of abdominal pain.  Patient report for the past 2 days she has had pain to her lower abdomen.  Pain is primarily to the right lower quadrant, described as achy and crampy, moderate to severe, comes in waves, with associated nausea.  Nothing seems to make the pain better or worse but she does endorse decrease in appetite.  She denies having fever or chills no chest pain or shortness of breath no productive cough loss of taste or smell.  She denies any dysuria hematuria vaginal bleeding or vaginal discharge.  No recent sexual partner.  She does have history of ovarian cyst but this pain felt different.  She still has an intact appendix.  She has not been vaccinated for COVID-19.  Her last menstrual period was a month ago.  No past medical history on file.  There are no problems to display for this patient.   Past Surgical History:  Procedure Laterality Date  . TYMPANOSTOMY TUBE PLACEMENT       OB History    Gravida  0   Para  0   Term  0   Preterm  0   AB  0   Living  0     SAB  0   IAB  0   Ectopic  0   Multiple  0   Live Births  0           No family history on file.  Social History   Tobacco Use  . Smoking status: Current Every Day Smoker    Packs/day: 0.50    Types: Cigarettes  . Smokeless tobacco: Never Used  Vaping Use  . Vaping Use: Every day  . Last attempt to quit: 01/24/2016  . Substances: Nicotine, THC, CBD, Flavoring  Substance Use Topics  . Alcohol use: Yes    Comment: occasional  . Drug use: No    Home Medications Prior to Admission medications   Medication Sig Start Date End Date  Taking? Authorizing Provider  amoxicillin (AMOXIL) 500 MG capsule Take 1 capsule (500 mg total) by mouth 3 (three) times daily. 10/07/19   Elson Areas, PA-C  HYDROcodone-acetaminophen (NORCO/VICODIN) 5-325 MG tablet Take 1 tablet by mouth every 4 (four) hours as needed. 10/07/19   Elson Areas, PA-C  HYDROcodone-acetaminophen (NORCO/VICODIN) 5-325 MG tablet Take 1 tablet by mouth every 4 (four) hours as needed. 10/07/19   Elson Areas, PA-C  ibuprofen (ADVIL,MOTRIN) 600 MG tablet Take 1 tablet (600 mg total) by mouth every 6 (six) hours as needed. 02/10/17   Law, Waylan Boga, PA-C  meclizine (ANTIVERT) 25 MG tablet Take 1 tablet (25 mg total) by mouth 3 (three) times daily as needed for dizziness. 08/19/17   Linwood Dibbles, MD  metoCLOPramide (REGLAN) 10 MG tablet Take 1 tablet (10 mg total) by mouth every 6 (six) hours. 08/19/17   Linwood Dibbles, MD  naproxen sodium (ANAPROX) 220 MG tablet Take 220-440 mg by mouth daily as needed.    [provider]  ondansetron (ZOFRAN ODT) 4 MG disintegrating tablet 4mg  ODT q4 hours prn nausea/vomit 11/23/18   11/25/18, MD  traMADol (ULTRAM) 50 MG tablet Take 1 tablet (50 mg total) by mouth every 6 (six) hours as needed. 11/23/18   Bethann Berkshire, MD    Allergies    Patient has no known allergies.  Review of Systems   Review of Systems  Gastrointestinal: Positive for abdominal pain.  All other systems reviewed and are negative.   Physical Exam Updated Vital Signs BP 122/74   Pulse 90   Temp 98.7 F (37.1 C) (Oral)   Resp 17   Ht 5\' 2"  (1.575 m)   Wt 48.1 kg   LMP 11/30/2020 (Approximate)   SpO2 100%   BMI 19.39 kg/m   Physical Exam Vitals and nursing note reviewed.  Constitutional:      General: She is not in acute distress.    Appearance: She is well-developed and well-nourished.  HENT:     Head: Atraumatic.  Eyes:     Conjunctiva/sclera: Conjunctivae normal.  Cardiovascular:     Rate and Rhythm: Normal rate and regular  rhythm.  Pulmonary:     Effort: Pulmonary effort is normal.     Breath sounds: Normal breath sounds.  Abdominal:     General: Abdomen is flat.     Palpations: Abdomen is soft.     Tenderness: There is abdominal tenderness in the right lower quadrant and periumbilical area. There is guarding. There is no rebound.  Musculoskeletal:     Cervical back: Neck supple.  Skin:    Findings: No rash.  Neurological:     Mental Status: She is alert.  Psychiatric:        Mood and Affect: Mood and affect normal.     ED Results / Procedures / Treatments   Labs (all labs ordered are listed, but only abnormal results are displayed) Labs Reviewed  WET PREP, GENITAL - Abnormal; Notable for the following components:      Result Value   WBC, Wet Prep HPF POC RARE (*)    All other components within normal limits  CBC WITH DIFFERENTIAL/PLATELET - Abnormal; Notable for the following components:   Hemoglobin 15.2 (*)    All other components within normal limits  COMPREHENSIVE METABOLIC PANEL  LIPASE, BLOOD  URINALYSIS, ROUTINE W REFLEX MICROSCOPIC  PREGNANCY, URINE  GC/CHLAMYDIA PROBE AMP ([place]) NOT AT [facility]    EKG None  Radiology CT ABDOMEN PELVIS W CONTRAST  Result Date: 12/22/2020 CLINICAL DATA:  Right lower quadrant abdominal pain, nausea for 2 days EXAM: CT ABDOMEN AND PELVIS WITH CONTRAST TECHNIQUE: Multidetector CT imaging of the abdomen and pelvis was performed using the standard protocol following bolus administration of intravenous contrast. CONTRAST:  12/24/2020 OMNIPAQUE IOHEXOL 300 MG/ML  SOLN COMPARISON:  11/23/2018 FINDINGS: Lower chest: No acute pleural or parenchymal lung disease. Hepatobiliary: No focal liver abnormality is seen. No gallstones, gallbladder wall thickening, or biliary dilatation. Pancreas: Unremarkable. No pancreatic ductal dilatation or surrounding inflammatory changes. Spleen: Normal in size without focal abnormality. Adrenals/Urinary Tract: Adrenal glands are  unremarkable. Kidneys are normal, without renal calculi, focal lesion, or hydronephrosis. Bladder is unremarkable. Stomach/Bowel: No bowel obstruction or ileus. The appendix is difficult to visualize, but is likely identified overlying the right iliac vessels on the coronal reconstructed series. No evidence of acute appendicitis. No inflammatory changes in the right lower quadrant. No bowel wall thickening. Vascular/Lymphatic: No significant vascular findings are present. No enlarged abdominal or pelvic lymph nodes. Reproductive: Uterus and bilateral adnexa are unremarkable. Other: No free fluid or free gas. No abdominal wall hernia. Musculoskeletal:  No acute or destructive bony lesions. Reconstructed images demonstrate no additional findings. IMPRESSION: No acute intra-abdominal or intrapelvic process. Electronically Signed   By: Sharlet Salina M.D.   On: 12/22/2020 19:16    Procedures Pelvic exam  Date/Time: 12/22/2020 5:48 PM Performed by: Fayrene Helper, PA-C Authorized by: Fayrene Helper, PA-C  Consent: Verbal consent obtained. Risks and benefits: risks, benefits and alternatives were discussed Consent given by: patient Patient understanding: patient states understanding of the procedure being performed Patient consent: the patient's understanding of the procedure matches consent given Patient identity confirmed: verbally with patient Local anesthesia used: no  Anesthesia: Local anesthesia used: no  Sedation: Patient sedated: no  Patient tolerance: patient tolerated the procedure well with no immediate complications Comments: Katie, RN, served as Biomedical engineer.  No inguinal lymphadenopathy, inguinal hernia noted.  Normal external genitalia.  Mild discomfort with speculum insertion.  Moderate amount of vaginal discharge noted.  Close cervical os with mild friable skin changes at the T-zone.  On bimanual examination, mild right adnexal tenderness without cervical motion tenderness.    (including  critical care time)  Medications Ordered in ED Medications  morphine 4 MG/ML injection 4 mg (4 mg Intravenous Given 12/22/20 1733)  ondansetron (ZOFRAN) injection 4 mg (4 mg Intravenous Given 12/22/20 1727)  iohexol (OMNIPAQUE) 300 MG/ML solution 100 mL (100 mLs Intravenous Contrast Given 12/22/20 1904)    ED Course  I have reviewed the triage vital signs and the nursing notes.  Pertinent labs & imaging results that were available during my care of the patient were reviewed by me and considered in my medical decision making (see chart for details).    MDM Rules/Calculators/A&P                          BP 122/74   Pulse 90   Temp 98.7 F (37.1 C) (Oral)   Resp 17   Ht 5\' 2"  (1.575 m)   Wt 48.1 kg   LMP 11/30/2020 (Approximate)   SpO2 100%   BMI 19.39 kg/m   Final Clinical Impression(s) / ED Diagnoses Final diagnoses:  Right lower quadrant abdominal pain    Rx / DC Orders ED Discharge Orders         Ordered    promethazine (PHENERGAN) 25 MG tablet  Every 6 hours PRN        12/22/20 1941         5:12 PM Patient presents with low abdominal pains more specifically to the right lower quadrant suggestive of potential appendicitis.  Given location of pain, will also perform pelvic examination.  5:49 PM Pelvic exam performed, low suspicion for PID.  Will obtain abdominal pelvis CT scan for to rule out appendicitis.  7:40 PM Labs are essentially normal, pelvic exam unremarkable, and abdominal pelvis CT scan obtained show no acute finding.  Upon further questioning, patient does admits to regular marijuana use.  I suspect her symptoms may be due to cannabinoid hyperemesis syndrome causing her discomfort.  Encourage patient to avoid marijuana use as it can worsen her symptoms.  She agrees.  Return precautions discussed.   12/24/20, PA-C 12/22/20 1942    12/24/20, MD 12/23/20 [phone]

## 2020-12-26 LAB — GC/CHLAMYDIA PROBE AMP (~~LOC~~) NOT AT ARMC
Chlamydia: NEGATIVE
Comment: NEGATIVE
Comment: NORMAL
Neisseria Gonorrhea: NEGATIVE

## 2020-12-31 DIAGNOSIS — N83209 Unspecified ovarian cyst, unspecified side: Secondary | ICD-10-CM

## 2020-12-31 HISTORY — DX: Unspecified ovarian cyst, unspecified side: N83.209

## 2022-06-17 ENCOUNTER — Encounter (HOSPITAL_COMMUNITY): Payer: Self-pay

## 2022-06-17 ENCOUNTER — Other Ambulatory Visit: Payer: Self-pay

## 2022-06-17 ENCOUNTER — Emergency Department (HOSPITAL_COMMUNITY)
Admission: EM | Admit: 2022-06-17 | Discharge: 2022-06-17 | Disposition: A | Discharge status: Home / Self Care | Payer: Self-pay | Attending: Emergency Medicine | Admitting: Emergency Medicine

## 2022-06-17 DIAGNOSIS — R590 Localized enlarged lymph nodes: Secondary | ICD-10-CM | POA: Insufficient documentation

## 2022-06-17 DIAGNOSIS — W57XXXA Bitten or stung by nonvenomous insect and other nonvenomous arthropods, initial encounter: Secondary | ICD-10-CM | POA: Insufficient documentation

## 2022-06-17 DIAGNOSIS — S0006XA Insect bite (nonvenomous) of scalp, initial encounter: Secondary | ICD-10-CM | POA: Insufficient documentation

## 2022-06-17 MED ORDER — DOXYCYCLINE HYCLATE 100 MG PO CAPS: 100.0000 mg | ORAL_CAPSULE | Freq: Two times a day (BID) | ORAL | 0 refills | Status: DC

## 2022-06-17 MED ORDER — DOXYCYCLINE HYCLATE 100 MG PO TABS
100.0000 mg | ORAL_TABLET | Freq: Once | ORAL | Status: AC
Start: 2022-06-17 — End: 2022-06-17
  Administered 2022-06-17: 100 mg via ORAL
  Filled 2022-06-17: qty 1

## 2022-06-17 NOTE — ED Triage Notes (Signed)
Pt presents with multiple tender nodules on the occipital region that appeared 1 week ago. Pt reports she had a tick bite on her scalp approx 2 weeks ago. Pt reports she has had subjective fevers.

## 2022-06-17 NOTE — Discharge Instructions (Signed)
The nodules of the base of your scalp are likely enlarged lymph nodes.  I feel this is probably secondary to your recent tick bite.  I recommend that you take the antibiotic as directed until it is finished.  You may take ibuprofen, 600 mg 3 times a day but take with food if needed for body aches and/or fever.  Follow-up with your primary care provider for recheck, if you do not have 1 please return to the emergency department for any new or worsening symptoms.

## 2022-06-17 NOTE — ED Provider Notes (Signed)
[facility] EMERGENCY DEPARTMENT Provider Note   CSN: [id number] Arrival date & time: 06/17/22  1847     History  Chief Complaint  Patient presents with   Mass    Ashley Navarro is a 26 y.o. female.  HPI     Ashley Navarro is a 26 y.o. female who presents to the Emergency Department complaining of painful areas of swelling at the base of her scalp.  She noticed a painful nodule to the right scalp area 1 week ago.  She noticed other painful nodules over the last several days.  She removed a tick from the top of her scalp 2 weeks ago.  She denies any sore throat, ear pain, fever or chills, joint pains, and rash.  No cough  Home Medications Prior to Admission medications   Medication Sig Start Date End Date Taking? Authorizing Provider  doxycycline (VIBRAMYCIN) 100 MG capsule Take 1 capsule (100 mg total) by mouth 2 (two) times daily. 06/17/22  Yes Kenli Waldo, PA-C      Allergies    Patient has no known allergies.    Review of Systems   Review of Systems  Constitutional:  Negative for appetite change, chills and fever.  Eyes:  Negative for visual disturbance.  Respiratory:  Negative for chest tightness and shortness of breath.   Cardiovascular:  Negative for chest pain.  Gastrointestinal:  Negative for nausea and vomiting.  Musculoskeletal:  Negative for arthralgias, back pain and neck stiffness.  Skin:  Negative for color change, rash and wound.  Neurological:  Positive for headaches. Negative for dizziness, syncope, weakness and numbness.  Hematological:  Positive for adenopathy.    Physical Exam Updated Vital Signs BP (!) 116/57 (BP Location: Right Arm)   Pulse 70   Temp 98.4 F (36.9 C) (Oral)   Resp 20   Ht 5\' 1"  (1.549 m)   Wt 49.9 kg   LMP 05/27/2022   SpO2 94%   BMI 20.78 kg/m  Physical Exam Vitals and nursing note reviewed.  Constitutional:      General: She is not in acute distress.    Appearance: Normal appearance. She is not  ill-appearing.  HENT:     Right Ear: Tympanic membrane and ear canal normal.     Left Ear: Tympanic membrane and ear canal normal.     Nose: No rhinorrhea.     Mouth/Throat:     Mouth: Mucous membranes are moist.     Pharynx: Oropharynx is clear. No oropharyngeal exudate or posterior oropharyngeal erythema.  Neck:     Trachea: Phonation normal.     Comments: Patient has 2 large palpable nodules to the base of the scalp on each side.  No enlarged anterior cervical nodes Cardiovascular:     Rate and Rhythm: Normal rate and regular rhythm.  Pulmonary:     Effort: Pulmonary effort is normal. No respiratory distress.     Breath sounds: Normal breath sounds.  Musculoskeletal:        General: Normal range of motion.     Cervical back: Tenderness present.  Lymphadenopathy:     Cervical: Cervical adenopathy present.     Right cervical: Posterior cervical adenopathy present.     Left cervical: Posterior cervical adenopathy present.  Skin:    General: Skin is warm.     Capillary Refill: Capillary refill takes less than 2 seconds.  Neurological:     Mental Status: She is alert.     ED Results / Procedures / Treatments  Labs (all labs ordered are listed, but only abnormal results are displayed) Labs Reviewed - No data to display  EKG None  Radiology No results found.  Procedures Procedures    Medications Ordered in ED Medications  doxycycline (VIBRA-TABS) tablet 100 mg (100 mg Oral Given 06/17/22 2314)    ED Course/ Medical Decision Making/ A&P                           Medical Decision Making Risk Prescription drug management.   Patient here with complaints of painful nodules at the base of her scalp persistent for greater than 1 week.  She states she removed a tick from the top of her scalp approximately 2 weeks ago.  She denies any other associated symptoms.  She does have some muscular tenderness with range of motion of her neck.  There is no meningeal signs, she is  well-appearing nontoxic.  Vital signs reassuring.  No arthralgias or rash noted.  Lymphadenopathy likely secondary to recent tick bite.  We will start patient on doxycycline.  She is agreeable to treatment plan and close outpatient follow-up.  Return precautions were discussed.        Final Clinical Impression(s) / ED Diagnoses Final diagnoses:  Tick bite of scalp, initial encounter  Lymphadenopathy, posterior cervical    Rx / DC Orders ED Discharge Orders          Ordered    doxycycline (VIBRAMYCIN) 100 MG capsule  2 times daily        06/17/22 2312              Pauline Aus, PA-C 06/17/22 2326    Vanetta Mulders, MD 06/30/22 [phone]

## 2022-11-19 IMAGING — CT CT ABD-PELV W/ CM
2 of 4 series · 16 of 46 positions shown, 18 images · IV contrast (Omnipaque or Isovue)
Comparison: 11/23/2018

CLINICAL DATA: Right lower quadrant abdominal pain, nausea for 2
days

EXAM:
CT ABDOMEN AND PELVIS WITH CONTRAST
TECHNIQUE: Multidetector CT imaging of the abdomen and pelvis was performed
using the standard protocol following bolus administration of
intravenous contrast.
CONTRAST:  100mL OMNIPAQUE IOHEXOL 300 MG/ML  SOLN

[Series 2: axial st · axial · 0.64mm/px · z∈[-551,-161]mm · 13 of 86 slices shown, 15 images]
[im 4/86  soft-tissue]
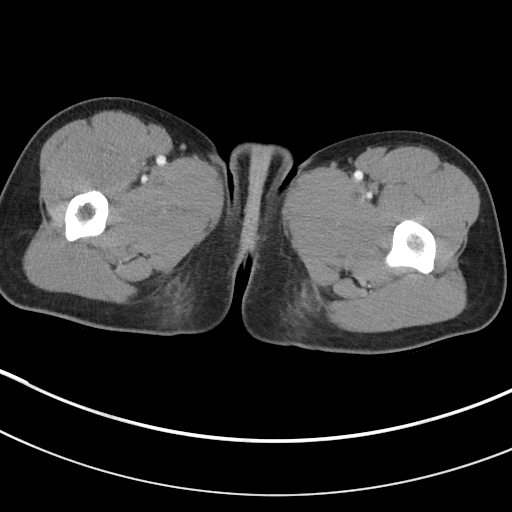
[im 4/86  bone]
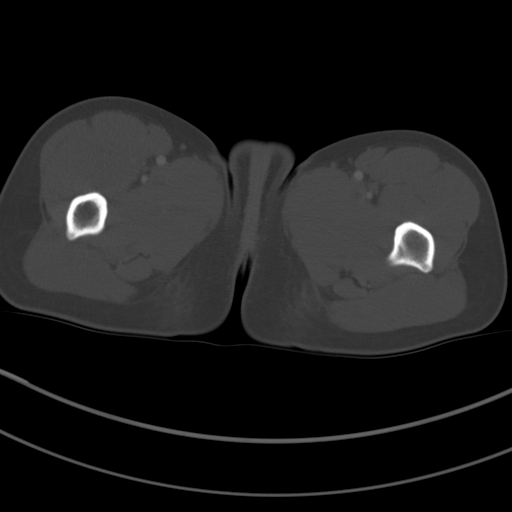
[im 11/86  soft-tissue]
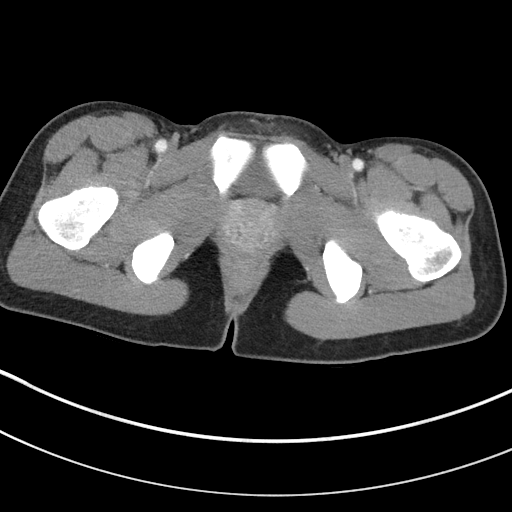
[im 18/86  soft-tissue]
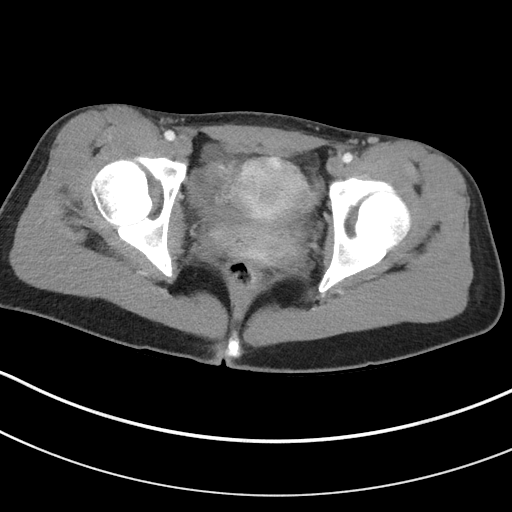
[im 24/86  soft-tissue]
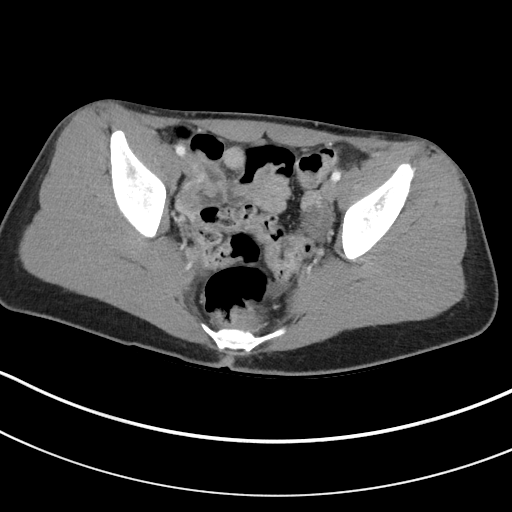
[im 31/86  soft-tissue]
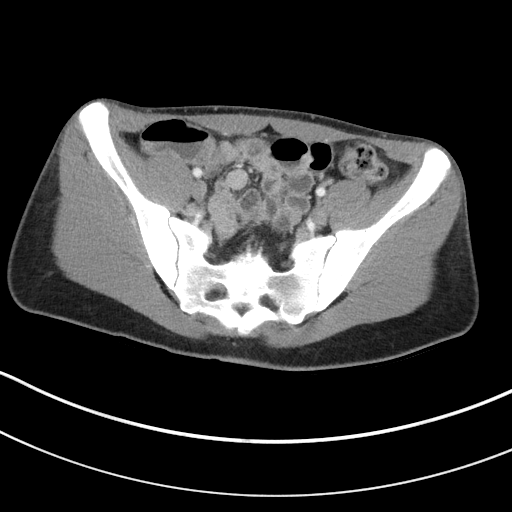
[im 38/86  soft-tissue]
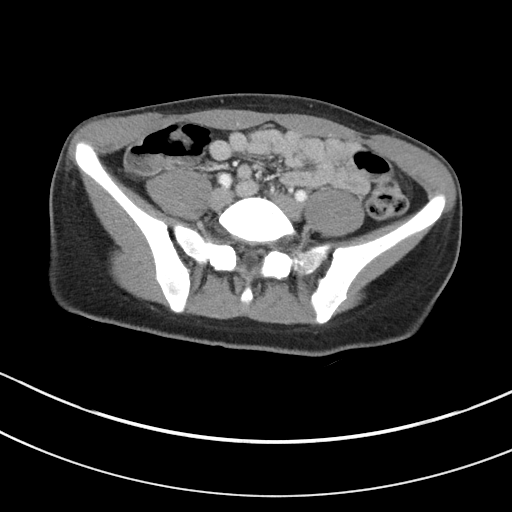
[im 45/86  soft-tissue]
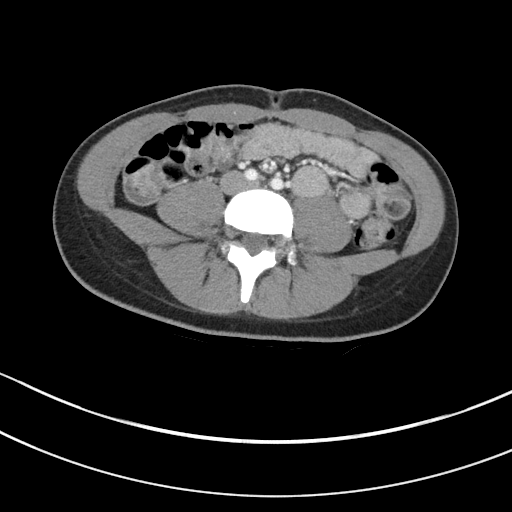
[im 48/86  soft-tissue]
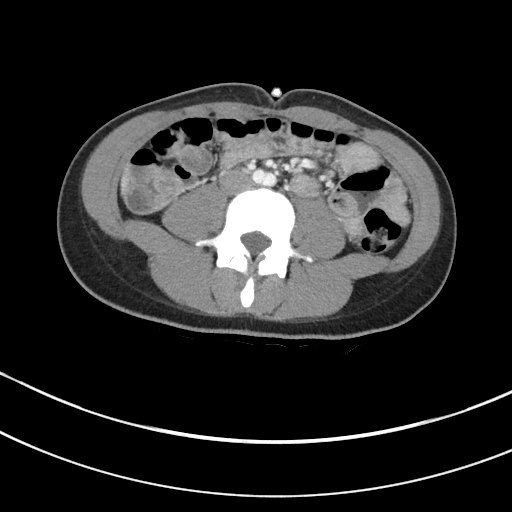
[im 55/86  soft-tissue]
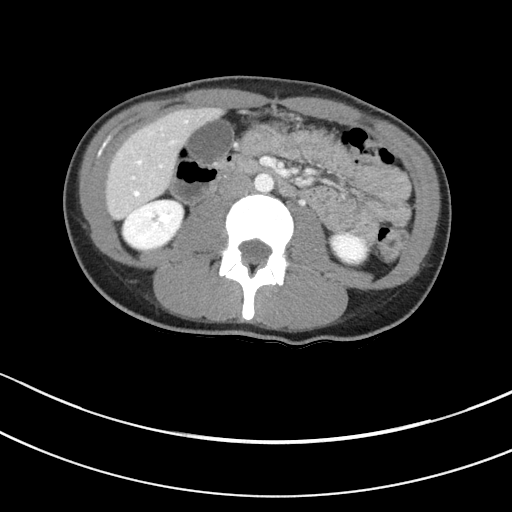
[im 55/86  bone]
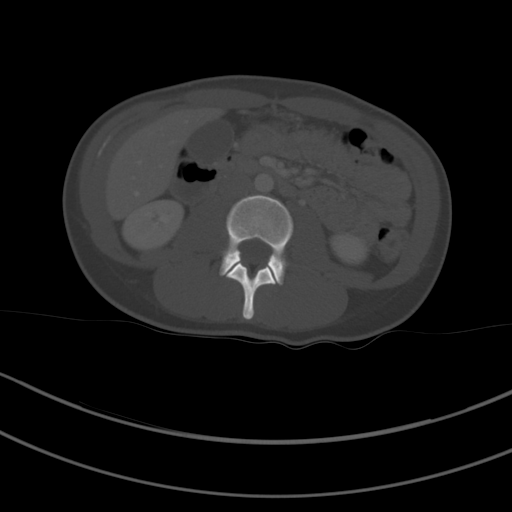
[im 62/86  soft-tissue]
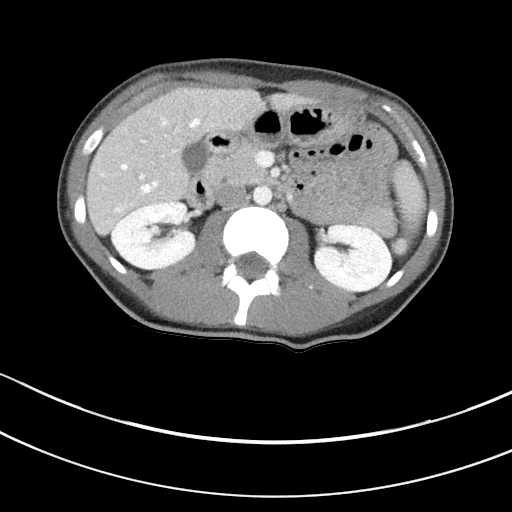
[im 69/86  soft-tissue]
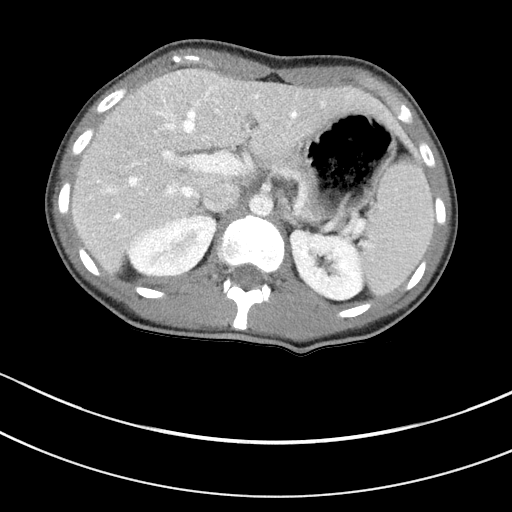
[im 75/86  soft-tissue]
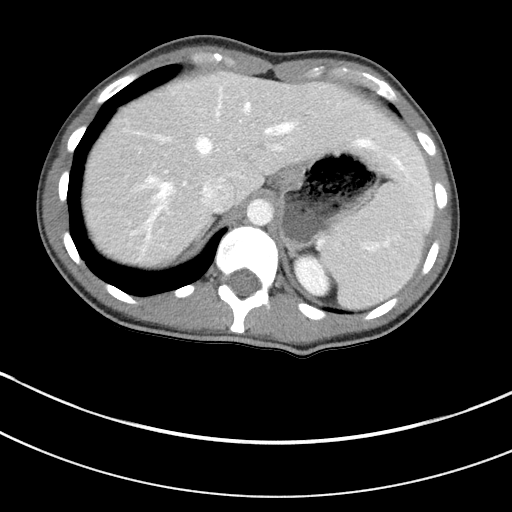
[im 82/86  soft-tissue]
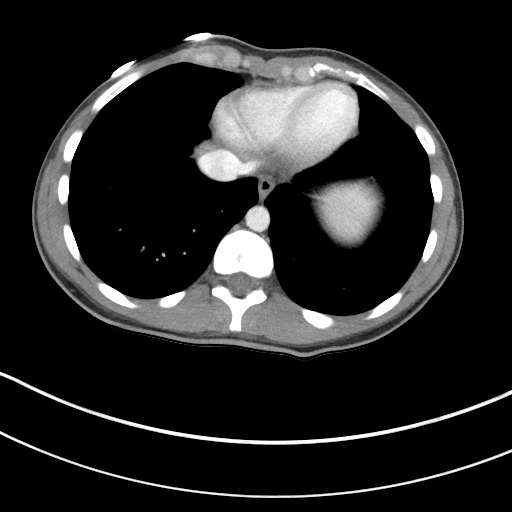

[Series 5: coronal st · coronal · 0.69mm/px · 3 of 84 slices shown]
[im 28/84  soft-tissue]
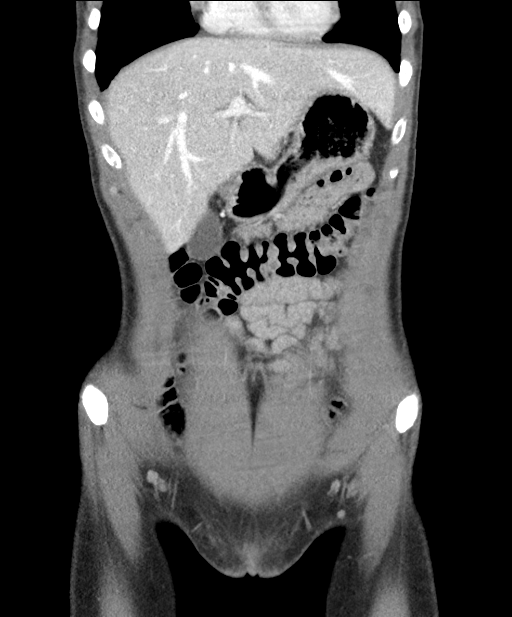
[im 37/84  soft-tissue]
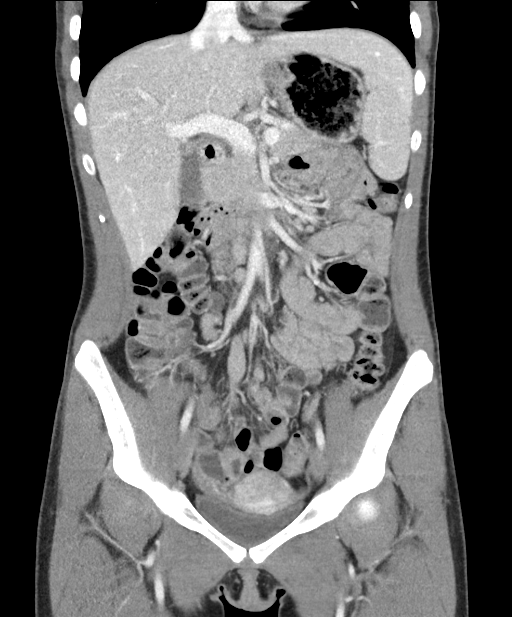
[im 47/84  soft-tissue]
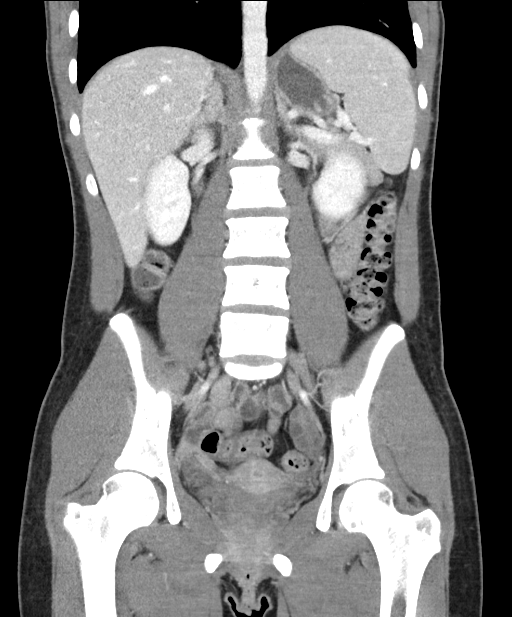

[16 of 46 positions shown; findings below may reference images not displayed]

FINDINGS: Lower chest: No acute pleural or parenchymal lung disease.

Hepatobiliary: No focal liver abnormality is seen. No gallstones,
gallbladder wall thickening, or biliary dilatation.

Pancreas: Unremarkable. No pancreatic ductal dilatation or
surrounding inflammatory changes.

Spleen: Normal in size without focal abnormality.

Adrenals/Urinary Tract: Adrenal glands are unremarkable. Kidneys are
normal, without renal calculi, focal lesion, or hydronephrosis.
Bladder is unremarkable.

Stomach/Bowel: No bowel obstruction or ileus. The appendix is
difficult to visualize, but is likely identified overlying the right
iliac vessels on the coronal reconstructed series. No evidence of
acute appendicitis. No inflammatory changes in the right lower
quadrant. No bowel wall thickening.

Vascular/Lymphatic: No significant vascular findings are present. No
enlarged abdominal or pelvic lymph nodes.

Reproductive: Uterus and bilateral adnexa are unremarkable.

Other: No free fluid or free gas. No abdominal wall hernia.

Musculoskeletal: No acute or destructive bony lesions. Reconstructed
images demonstrate no additional findings.
IMPRESSION: No acute intra-abdominal or intrapelvic process.

## 2024-01-01 NOTE — L&D Delivery Note (Cosign Needed Addendum)
 OB/GYN Faculty Practice Delivery Note  Ashley Navarro is a 28 y.o. G1P0000 s/p SVD at [redacted]w[redacted]d. She was admitted for IOL for postdates.   ROM: 25h 57m with terminal meconium fluid GBS Status: negative Maximum Maternal Temperature: 102.32F  Labor Progress: Patient developed a fever once complete and pushing, started on gent/amp for chorio coverage.  Delivery Date/Time: 10/27/2024 @2332  Delivery: Called to room and patient was complete and pushing. Head delivered straight occiput anterior. nuchal cord absent. Shoulder and body delivered in usual fashion. Infant without spontaneous cry, placed on mother's abdomen, dried and stimulated. Still depressed, brought to warmer by RNs for resuscitation. Cord clamped x 2 after 1-minute delay, and cut by father. Cord blood drawn. Placenta delivered spontaneously with gentle cord traction. Fundus firm with massage and Pitocin. Labia, perineum, vagina, and cervix inspected inspected with 2nd degree laceration.   Placenta:  spontaneous Intact Complications:maternal fever Lacerations: 2nd degree, perineal to left labial QBL: Analgesia: Epidural  Newborn Data: Birth date:10/27/2023 Birth time:11:32 PM Gender:Female Living status:Living Apgars:5 ,7  Tzphyu:6339 g           Lavanda Aline, MD 10/27/24  GME ATTESTATION:  Evaluation and management procedures were performed by the [facility] Medicine Resident under my supervision. I was immediately available for direct supervision, assistance and direction throughout this encounter.  I also confirm that I have verified the information documented in the resident's note, and that I have also personally reperformed the pertinent components of the physical exam and all of the medical decision making activities.  I have also made any necessary editorial changes.  [name] [name] Pouch, MD OB Fellow, Faculty Practice [facility], [facility] 10/28/2024 10:51 PM

## 2024-01-24 ENCOUNTER — Encounter: Payer: Self-pay | Admitting: Emergency Medicine

## 2024-01-24 ENCOUNTER — Ambulatory Visit
Admission: EM | Admit: 2024-01-24 | Discharge: 2024-01-24 | Disposition: A | Discharge status: Home / Self Care | Payer: Self-pay | Attending: Family Medicine | Admitting: Family Medicine

## 2024-01-24 DIAGNOSIS — H66002 Acute suppurative otitis media without spontaneous rupture of ear drum, left ear: Secondary | ICD-10-CM

## 2024-01-24 MED ORDER — ACETAMINOPHEN 500 MG PO TABS
1000.0000 mg | ORAL_TABLET | Freq: Once | ORAL | Status: AC
  Administered 2024-01-24: 1000 mg via ORAL

## 2024-01-24 MED ORDER — PSEUDOEPHEDRINE HCL 60 MG PO TABS
60.0000 mg | ORAL_TABLET | Freq: Three times a day (TID) | ORAL | 0 refills | Status: DC | PRN
Start: 2024-01-24 — End: 2024-06-02

## 2024-01-24 MED ORDER — AZITHROMYCIN 250 MG PO TABS: ORAL_TABLET | ORAL | 0 refills | Status: DC

## 2024-01-24 MED ORDER — FLUTICASONE PROPIONATE 50 MCG/ACT NA SUSP: 1.0000 | Freq: Two times a day (BID) | NASAL | 2 refills | Status: AC

## 2024-01-24 NOTE — ED Provider Notes (Signed)
[facility]    CSN: [id number] Arrival date & time: 01/24/24  4098      History   Chief Complaint No chief complaint on file.   HPI Ashley Navarro is a 28 y.o. female.   Patient presenting today with 1 week history of sinus pain and pressure, congestion and now severe sharp stabbing left ear pain that woke her up in the middle the night.  Denies fever, chills, cough, chest pain, shortness of breath, abdominal pain, nausea vomiting or diarrhea.  Taking ibuprofen with minimal relief.  History of frequent ear infections.    History reviewed. No pertinent past medical history.  There are no active problems to display for this patient.   Past Surgical History:  Procedure Laterality Date   TYMPANOSTOMY TUBE PLACEMENT      OB History     Gravida  0   Para  0   Term  0   Preterm  0   AB  0   Living  0      SAB  0   IAB  0   Ectopic  0   Multiple  0   Live Births  0            Home Medications    Prior to Admission medications   Medication Sig Start Date End Date Taking? Authorizing Provider  azithromycin (ZITHROMAX) 250 MG tablet Take first 2 tablets together, then 1 every day until finished. 01/24/24  Yes Particia Nearing, PA-C  fluticasone [facility]) 50 MCG/ACT nasal spray Place 1 spray into both nostrils 2 (two) times daily. 01/24/24  Yes Particia Nearing, PA-C  pseudoephedrine (SUDAFED) 60 MG tablet Take 1 tablet (60 mg total) by mouth every 8 (eight) hours as needed for congestion. 01/24/24  Yes Particia Nearing, PA-C    Family History History reviewed. No pertinent family history.  Social History Social History   Tobacco Use   Smoking status: Former    Current packs/day: 0.50    Types: Cigarettes   Smokeless tobacco: Never  Vaping Use   Vaping status: Every Day   Last attempt to quit: 01/24/2016   Substances: Nicotine, THC, CBD, Flavoring  Substance Use Topics   Alcohol use: Not Currently    Comment:  occasional   Drug use: Yes    Types: Marijuana     Allergies   Patient has no known allergies.   Review of Systems Review of Systems PER HPI  Physical Exam Triage Vital Signs ED Triage Vitals  Encounter Vitals Group     BP 01/24/24 0844 107/70     Systolic BP Percentile --      Diastolic BP Percentile --      Pulse Rate 01/24/24 0844 76     Resp 01/24/24 0844 18     Temp 01/24/24 0844 98.1 F (36.7 C)     Temp Source 01/24/24 0844 Oral     SpO2 01/24/24 0844 97 %     Weight --      Height --      Head Circumference --      Peak Flow --      Pain Score 01/24/24 0846 9     Pain Loc --      Pain Education --      Exclude from Growth Chart --    No data found.  Updated Vital Signs BP 107/70 (BP Location: Right Arm)   Pulse 76   Temp 98.1 F (36.7 C) (Oral)  Resp 18   LMP 01/13/2024 (Exact Date)   SpO2 97%   Visual Acuity Right Eye Distance:   Left Eye Distance:   Bilateral Distance:    Right Eye Near:   Left Eye Near:    Bilateral Near:     Physical Exam Vitals and nursing note reviewed.  Constitutional:      Appearance: Normal appearance.  HENT:     Head: Atraumatic.     Right Ear: Tympanic membrane and external ear normal.     Left Ear: External ear normal.     Ears:     Comments: Right TM erythematous and edematous    Nose: Rhinorrhea present.     Mouth/Throat:     Mouth: Mucous membranes are moist.     Pharynx: No oropharyngeal exudate or posterior oropharyngeal erythema.  Eyes:     Extraocular Movements: Extraocular movements intact.     Conjunctiva/sclera: Conjunctivae normal.  Cardiovascular:     Rate and Rhythm: Normal rate and regular rhythm.     Heart sounds: Normal heart sounds.  Pulmonary:     Effort: Pulmonary effort is normal.     Breath sounds: Normal breath sounds. No wheezing or rales.  Musculoskeletal:        General: Normal range of motion.     Cervical back: Normal range of motion and neck supple.  Skin:    General:  Skin is warm and dry.  Neurological:     Mental Status: She is alert and oriented to person, place, and time.  Psychiatric:        Mood and Affect: Mood normal.        Thought Content: Thought content normal.      UC Treatments / Results  Labs (all labs ordered are listed, but only abnormal results are displayed) Labs Reviewed - No data to display  EKG   Radiology No results found.  Procedures Procedures (including critical care time)  Medications Ordered in UC Medications  acetaminophen (TYLENOL) tablet 1,000 mg (1,000 mg Oral Given 01/24/24 0908)    Initial Impression / Assessment and Plan / UC Course  I have reviewed the triage vital signs and the nursing notes.  Pertinent labs & imaging results that were available during my care of the patient were reviewed by me and considered in my medical decision making (see chart for details).     Vital signs within normal limits, treat for left ear infection with Tylenol and ibuprofen, Zithromax, Flonase, Sudafed.  Return for worsening symptoms  Final Clinical Impressions(s) / UC Diagnoses   Final diagnoses:  Acute suppurative otitis media of left ear without spontaneous rupture of tympanic membrane, recurrence not specified   Discharge Instructions   None    ED Prescriptions     Medication Sig Dispense Auth. Provider   azithromycin (ZITHROMAX) 250 MG tablet Take first 2 tablets together, then 1 every day until finished. 6 tablet Particia Nearing, PA-C   fluticasone [facility]) 50 MCG/ACT nasal spray Place 1 spray into both nostrils 2 (two) times daily. 16 g Particia Nearing, [place]   pseudoephedrine (SUDAFED) 60 MG tablet Take 1 tablet (60 mg total) by mouth every 8 (eight) hours as needed for congestion. 10 tablet Particia Nearing, [place]      PDMP not reviewed this encounter.   Particia Nearing, [place] 01/24/24 0930

## 2024-01-24 NOTE — ED Triage Notes (Signed)
Left ear pain that started this morning.  States left side of face hurts.  States sinuses has been inflamed x 1 week.   Has been taking robitussin.

## 2024-03-12 LAB — OB RESULTS CONSOLE HIV ANTIBODY (ROUTINE TESTING): HIV: NONREACTIVE

## 2024-03-12 LAB — OB RESULTS CONSOLE RUBELLA ANTIBODY, IGM: Rubella: IMMUNE

## 2024-03-12 LAB — OB RESULTS CONSOLE RPR: RPR: NONREACTIVE

## 2024-03-12 LAB — OB RESULTS CONSOLE GC/CHLAMYDIA
Chlamydia: NEGATIVE
Neisseria Gonorrhea: NEGATIVE

## 2024-03-12 LAB — OB RESULTS CONSOLE ABO/RH: RH Type: POSITIVE

## 2024-03-12 LAB — OB RESULTS CONSOLE ANTIBODY SCREEN: Antibody Screen: NEGATIVE

## 2024-03-12 LAB — HEPATITIS C ANTIBODY: HCV Ab: NEGATIVE

## 2024-03-12 LAB — OB RESULTS CONSOLE HEPATITIS B SURFACE ANTIGEN: Hepatitis B Surface Ag: NEGATIVE

## 2024-05-07 ENCOUNTER — Other Ambulatory Visit: Payer: Self-pay | Admitting: Obstetrics & Gynecology

## 2024-05-07 DIAGNOSIS — Z363 Encounter for antenatal screening for malformations: Secondary | ICD-10-CM

## 2024-06-02 ENCOUNTER — Other Ambulatory Visit: Payer: Self-pay

## 2024-06-02 ENCOUNTER — Encounter: Payer: Self-pay | Admitting: *Deleted

## 2024-06-02 DIAGNOSIS — O99330 Smoking (tobacco) complicating pregnancy, unspecified trimester: Secondary | ICD-10-CM | POA: Insufficient documentation

## 2024-06-02 DIAGNOSIS — O99320 Drug use complicating pregnancy, unspecified trimester: Secondary | ICD-10-CM | POA: Insufficient documentation

## 2024-06-09 ENCOUNTER — Ambulatory Visit: Payer: Self-pay | Attending: Obstetrics & Gynecology

## 2024-06-09 ENCOUNTER — Ambulatory Visit: Payer: Self-pay | Admitting: Obstetrics and Gynecology

## 2024-06-09 ENCOUNTER — Other Ambulatory Visit: Payer: Self-pay | Admitting: Obstetrics & Gynecology

## 2024-06-09 ENCOUNTER — Other Ambulatory Visit: Payer: Self-pay

## 2024-06-09 VITALS — BP 107/71 | HR 85

## 2024-06-09 DIAGNOSIS — O99330 Smoking (tobacco) complicating pregnancy, unspecified trimester: Secondary | ICD-10-CM | POA: Diagnosis present

## 2024-06-09 DIAGNOSIS — O99320 Drug use complicating pregnancy, unspecified trimester: Secondary | ICD-10-CM | POA: Diagnosis present

## 2024-06-09 DIAGNOSIS — Z363 Encounter for antenatal screening for malformations: Secondary | ICD-10-CM | POA: Insufficient documentation

## 2024-06-09 DIAGNOSIS — Z3A21 21 weeks gestation of pregnancy: Secondary | ICD-10-CM

## 2024-06-09 DIAGNOSIS — F1290 Cannabis use, unspecified, uncomplicated: Secondary | ICD-10-CM

## 2024-06-09 DIAGNOSIS — O35BXX0 Maternal care for other (suspected) fetal abnormality and damage, fetal cardiac anomalies, not applicable or unspecified: Secondary | ICD-10-CM

## 2024-06-09 DIAGNOSIS — O283 Abnormal ultrasonic finding on antenatal screening of mother: Secondary | ICD-10-CM | POA: Insufficient documentation

## 2024-06-09 DIAGNOSIS — O99322 Drug use complicating pregnancy, second trimester: Secondary | ICD-10-CM

## 2024-06-09 NOTE — Progress Notes (Signed)
 Maternal-Fetal Medicine Consultation Name: Ashley Navarro MRN: [id number]  G1 P0 at 21-weeks' gestation. Patient is here for fetal anatomy scan. On cell-free fetal DNA screening, the risks of aneuploidies are not increased.   Pregravid BMI 18.3.  We performed fetal anatomy scan.  An echogenic intracardiac focus is seen.  No other makers of aneuploidies or fetal structural defects are seen. Fetal biometry is consistent with her previously-established dates. Amniotic fluid is normal and good fetal activity is seen. Patient understands the limitations of ultrasound in detecting fetal anomalies.   Echogenic intracardiac focus (EIF) EIF is present in about 3% to 4% of normal fetuses and in some fetuses with Down syndrome.  Given that she has low risk for fetal Down syndrome on cell-free fetal DNA screening, this finding should be considered a normal variant.  Although I did not recommend amniocentesis for this finding, I explained that only amniocentesis will give a definitive result on the fetal karyotype.  I explained the procedure and possible complication of miscarriage (1 and 500 procedures).  Maternal Underweight It is defined as BMI less than 18.5 Kg/m2.  Studies have shown that low maternal weight can be associated with increased risks of fetal growth restriction, low birthweight and preterm delivery. I reassured the patient that most fetuses, if diagnosed with fetal growth restriction, are constitutionally small.  Recommendations - An appointment was made for her to return in 10 weeks for fetal growth assessment.  Consultation including face-to-face (more than 50%) counseling 30 minutes.

## 2024-08-18 ENCOUNTER — Telehealth: Payer: Self-pay

## 2024-08-18 ENCOUNTER — Ambulatory Visit

## 2024-08-25 ENCOUNTER — Ambulatory Visit (HOSPITAL_BASED_OUTPATIENT_CLINIC_OR_DEPARTMENT_OTHER)

## 2024-08-25 ENCOUNTER — Ambulatory Visit: Attending: Obstetrics and Gynecology | Admitting: Maternal & Fetal Medicine

## 2024-08-25 VITALS — BP 107/68

## 2024-08-25 DIAGNOSIS — O99320 Drug use complicating pregnancy, unspecified trimester: Secondary | ICD-10-CM

## 2024-08-25 DIAGNOSIS — O43193 Other malformation of placenta, third trimester: Secondary | ICD-10-CM

## 2024-08-25 DIAGNOSIS — O99323 Drug use complicating pregnancy, third trimester: Secondary | ICD-10-CM | POA: Insufficient documentation

## 2024-08-25 DIAGNOSIS — O35EXX0 Maternal care for other (suspected) fetal abnormality and damage, fetal genitourinary anomalies, not applicable or unspecified: Secondary | ICD-10-CM

## 2024-08-25 DIAGNOSIS — F1290 Cannabis use, unspecified, uncomplicated: Secondary | ICD-10-CM | POA: Insufficient documentation

## 2024-08-25 DIAGNOSIS — Z3A32 32 weeks gestation of pregnancy: Secondary | ICD-10-CM

## 2024-08-25 NOTE — Progress Notes (Signed)
 After review, MFM consult with provider is not indicated for today  Ashley Nathanel Pipe, MD 08/25/2024 4:56 PM  [facility]

## 2024-09-28 LAB — OB RESULTS CONSOLE GBS: GBS: NEGATIVE

## 2024-10-08 ENCOUNTER — Inpatient Hospital Stay (HOSPITAL_COMMUNITY)
Admission: AD | Admit: 2024-10-08 | Discharge: 2024-10-08 | Disposition: A | Discharge status: Home / Self Care | Attending: Obstetrics and Gynecology | Admitting: Obstetrics and Gynecology

## 2024-10-08 ENCOUNTER — Encounter (HOSPITAL_COMMUNITY): Payer: Self-pay | Admitting: Obstetrics and Gynecology

## 2024-10-08 DIAGNOSIS — O368130 Decreased fetal movements, third trimester, not applicable or unspecified: Secondary | ICD-10-CM | POA: Insufficient documentation

## 2024-10-08 DIAGNOSIS — B379 Candidiasis, unspecified: Secondary | ICD-10-CM | POA: Diagnosis not present

## 2024-10-08 DIAGNOSIS — Z3493 Encounter for supervision of normal pregnancy, unspecified, third trimester: Secondary | ICD-10-CM

## 2024-10-08 DIAGNOSIS — O471 False labor at or after 37 completed weeks of gestation: Secondary | ICD-10-CM | POA: Insufficient documentation

## 2024-10-08 DIAGNOSIS — O212 Late vomiting of pregnancy: Secondary | ICD-10-CM | POA: Diagnosis not present

## 2024-10-08 DIAGNOSIS — O479 False labor, unspecified: Secondary | ICD-10-CM | POA: Diagnosis not present

## 2024-10-08 DIAGNOSIS — O98813 Other maternal infectious and parasitic diseases complicating pregnancy, third trimester: Secondary | ICD-10-CM | POA: Diagnosis not present

## 2024-10-08 DIAGNOSIS — Z3A38 38 weeks gestation of pregnancy: Secondary | ICD-10-CM | POA: Diagnosis not present

## 2024-10-08 DIAGNOSIS — Z7982 Long term (current) use of aspirin: Secondary | ICD-10-CM | POA: Diagnosis not present

## 2024-10-08 DIAGNOSIS — R519 Headache, unspecified: Secondary | ICD-10-CM | POA: Insufficient documentation

## 2024-10-08 DIAGNOSIS — Z87891 Personal history of nicotine dependence: Secondary | ICD-10-CM | POA: Diagnosis not present

## 2024-10-08 LAB — URINALYSIS, ROUTINE W REFLEX MICROSCOPIC
Bilirubin Urine: NEGATIVE
Glucose, UA: NEGATIVE mg/dL
Hgb urine dipstick: NEGATIVE
Ketones, ur: NEGATIVE mg/dL
Nitrite: NEGATIVE
Protein, ur: NEGATIVE mg/dL
Specific Gravity, Urine: 1.008 (ref 1.005–1.030)
pH: 7 (ref 5.0–8.0)

## 2024-10-08 LAB — RUPTURE OF MEMBRANE (ROM)PLUS: Rom Plus: NEGATIVE

## 2024-10-08 LAB — WET PREP, GENITAL
Sperm: NONE SEEN
Trich, Wet Prep: NONE SEEN
WBC, Wet Prep HPF POC: >=10 — AB (ref <10)

## 2024-10-08 LAB — POCT FERN TEST: POCT Fern Test: NEGATIVE

## 2024-10-08 MED ORDER — ACETAMINOPHEN-CAFFEINE 500-65 MG PO TABS
2.0000 | ORAL_TABLET | Freq: Once | ORAL | Status: AC
  Administered 2024-10-08: 2 via ORAL
  Filled 2024-10-08: qty 2

## 2024-10-08 MED ORDER — TERCONAZOLE 0.4 % VA CREA: 1.0000 | TOPICAL_CREAM | Freq: Every day | VAGINAL | 0 refills | Status: DC

## 2024-10-08 NOTE — MAU Note (Addendum)
 Ashley Navarro is a 29 y.o. at [redacted]w[redacted]d here in MAU reporting ctxs all last night and today. Reports a h/a this afternoon and sharp pains in pee hole area. Feels like a knife as well as pressure. No dysuria. No VB and no IOL. 3cm on Monday. States has some swelling which has increased today  LMP: na Onset of complaint: last night Pain score: 5 Vitals:   10/08/24 1941 10/08/24 1944  BP:  116/69  Pulse: 61   Resp: 18   Temp: 98.4 F (36.9 C)   SpO2: 100%      FHT: 130  Lab orders placed from triage: ua

## 2024-10-08 NOTE — MAU Provider Note (Signed)
 History     CSN: [id number]  Arrival date and time: 10/08/24 1910   Event Date/Time   First Provider Initiated Contact with Patient 10/08/24 2017      Chief Complaint  Patient presents with   Contractions   Nausea   Decreased Fetal Movement   Ashley Navarro , a  28 y.o. G1P0000 at [redacted]w[redacted]d presents to MAU with complaints of headache nausea and contractions since last night. Patient states that She has been having consistent contractions since 8pm last night. Reports every 15-7 mins. She states that they woke her up out of her sleep. She denies vaginal bleeding, leaking of fluid and originally denied FM, but reports fetal movement increased after arrival to MAU. She states that she has not had much to eat today due to the nausea, but endorses drinking water. Reports a headache since 4pm this afternoon. She denies attempting to relieve symptoms and currently reports as a 9/10. She also endorses increased swelling that improves overnight.          OB History     Gravida  1   Para  0   Term  0   Preterm  0   AB  0   Living  0      SAB  0   IAB  0   Ectopic  0   Multiple  0   Live Births  0           Past Medical History:  Diagnosis Date   Medical history non-contributory    Tooth decay     Past Surgical History:  Procedure Laterality Date   MULTIPLE TOOTH EXTRACTIONS     TYMPANOSTOMY TUBE PLACEMENT      Family History  Problem Relation Age of Onset   Asthma Neg Hx    Cancer Neg Hx    Diabetes Neg Hx    Heart disease Neg Hx    Hypertension Neg Hx     Social History   Tobacco Use   Smoking status: Former    Current packs/day: 0.50    Types: Cigarettes   Smokeless tobacco: Never  Vaping Use   Vaping status: Former   Quit date: 01/24/2016   Substances: Nicotine, THC, CBD, Flavoring  Substance Use Topics   Alcohol use: Not Currently    Comment: occasional   Drug use: Not Currently    Types: Marijuana    Comment: pt says end of 2nd  trimester    Allergies: No Known Allergies  Medications Prior to Admission  Medication Sig Dispense Refill Last Dose/Taking   aspirin EC 81 MG tablet Take 81 mg by mouth daily. Swallow whole.      ferrous sulfate 325 (65 FE) MG EC tablet Take 325 mg by mouth daily with breakfast.      fluticasone  (FLONASE ) 50 MCG/ACT nasal spray Place 1 spray into both nostrils 2 (two) times daily. (Patient not taking: Reported on 06/09/2024) 16 g 2    Prenatal Vit-Fe Fumarate-FA (MULTIVITAMIN-PRENATAL) 27-0.8 MG TABS tablet Take 1 tablet by mouth daily at 12 noon.       Review of Systems  Constitutional:  Negative for chills, fatigue and fever.  Eyes:  Negative for pain and visual disturbance.  Respiratory:  Negative for apnea, shortness of breath and wheezing.   Cardiovascular:  Negative for chest pain and palpitations.  Gastrointestinal:  Positive for abdominal pain and nausea. Negative for constipation and diarrhea.  Genitourinary:  Positive for pelvic pain, vaginal discharge and vaginal pain.  Negative for difficulty urinating, dysuria and vaginal bleeding.  Musculoskeletal:  Positive for back pain.  Neurological:  Positive for headaches. Negative for dizziness, seizures, weakness and light-headedness.  Psychiatric/Behavioral:  Negative for suicidal ideas.    Physical Exam   Blood pressure 113/76, pulse (!) 57, temperature 98.4 F (36.9 C), resp. rate 18, height 5' 2 (1.575 m), weight 67.6 kg, last menstrual period 01/14/2024, SpO2 100%.  Physical Exam Vitals and nursing note reviewed.  Constitutional:      General: She is not in acute distress.    Appearance: Normal appearance.  HENT:     Head: Normocephalic.  Cardiovascular:     Rate and Rhythm: Normal rate and regular rhythm.  Pulmonary:     Effort: Pulmonary effort is normal.     Breath sounds: Normal breath sounds.  Abdominal:     Tenderness: There is abdominal tenderness.     Comments: Gravid uterus. Leopolds Vertex position. Mild  to moderate contractions palpated.   Musculoskeletal:        General: Swelling present.     Cervical back: Normal range of motion.     Comments: Mild non-pitting edema bilaterally   Skin:    General: Skin is warm and dry.  Neurological:     Mental Status: She is alert and oriented to person, place, and time.  Psychiatric:        Mood and Affect: Mood normal.    FHT: 120bpm with moderate variability 15x15 accels no decels  Toco: ctx q 7-10 mins.   MAU Course  Procedures Orders Placed This Encounter  Procedures   Culture, OB Urine   Urinalysis, Routine w reflex microscopic -Urine, Clean Catch   Results for orders placed or performed during the hospital encounter of 10/08/24 (from the past 48 hours)  Urinalysis, Routine w reflex microscopic -Urine, Clean Catch     Status: Abnormal   Collection Time: 10/08/24  7:48 PM  Result Value Ref Range   Color, Urine YELLOW YELLOW   APPearance HAZY (A) CLEAR   Specific Gravity, Urine 1.008 1.005 - 1.030   pH 7.0 5.0 - 8.0   Glucose, UA NEGATIVE NEGATIVE mg/dL   Hgb urine dipstick NEGATIVE NEGATIVE   Bilirubin Urine NEGATIVE NEGATIVE   Ketones, ur NEGATIVE NEGATIVE mg/dL   Protein, ur NEGATIVE NEGATIVE mg/dL   Nitrite NEGATIVE NEGATIVE   Leukocytes,Ua LARGE (A) NEGATIVE   RBC / HPF 0-5 0 - 5 RBC/hpf   WBC, UA 21-50 0 - 5 WBC/hpf   Bacteria, UA RARE (A) NONE SEEN   Squamous Epithelial / HPF 11-20 0 - 5 /HPF   Mucus PRESENT     Comment: Performed at [facility], [address], [place], [place] [address]    MDM - UA positive for leuks and bacteria, reflexed to culture to rule out UTI.  - Patient endorses positive fetal movement.  - Dilation: 2.5 Effacement (%): 60 Station: -3 Presentation: Vertex Exam by:: GEANNIE Punter, RN - Plan to recheck in 1 hour.  - Cervix unchanged however patient now reports feeling wet and leaking.  - Fern and Rom plus colleted.  - Notable for thick white vaginal discharge on exam, wet prep also  ordered.  [place]OD Distance and Rom plus negative. Low suspicion for ROM, - Wet prep positive for yeast and likely related to uterine irritability and discomfort. - plan for discharge.   Assessment and Plan   1. False labor   2. Yeast infection   3. [redacted] weeks gestation  of pregnancy   4. Intact amniotic membranes during pregnancy in third trimester   5. Movement of fetus present during pregnancy in third trimester    - Reviewed worsening signs and return precautions.  - Rx for Terazol sent to outpatient pharmacy for pick up.  - Reviewed signs and symptoms of labor.  - FHT appropriate for gestational age at time of discharge.  - Patient discharged home in stable condition and may return to MAU as needed.   Claris CHRISTELLA Cedar, MSN CNM  10/08/2024, 8:17 PM

## 2024-10-08 NOTE — MAU Note (Signed)
 Patient reports DFM since yesterday. Reports she feels movement, but it has been less than normal.

## 2024-10-10 LAB — CULTURE, OB URINE: Culture: NO GROWTH

## 2024-10-22 ENCOUNTER — Telehealth (HOSPITAL_COMMUNITY): Payer: Self-pay | Admitting: *Deleted

## 2024-10-22 ENCOUNTER — Encounter (HOSPITAL_COMMUNITY): Payer: Self-pay | Admitting: *Deleted

## 2024-10-22 NOTE — Telephone Encounter (Signed)
 Preadmission screen

## 2024-10-26 ENCOUNTER — Encounter (HOSPITAL_COMMUNITY): Payer: Self-pay | Admitting: Family Medicine

## 2024-10-26 ENCOUNTER — Inpatient Hospital Stay (HOSPITAL_COMMUNITY)
Admission: AD | Admit: 2024-10-26 | Discharge: 2024-10-29 | DRG: 805 | Disposition: A | Discharge status: Home / Self Care | Payer: Self-pay | Attending: Obstetrics and Gynecology | Admitting: Obstetrics and Gynecology

## 2024-10-26 ENCOUNTER — Encounter (HOSPITAL_COMMUNITY): Payer: Self-pay

## 2024-10-26 DIAGNOSIS — O99324 Drug use complicating childbirth: Secondary | ICD-10-CM | POA: Diagnosis present

## 2024-10-26 DIAGNOSIS — O411230 Chorioamnionitis, third trimester, not applicable or unspecified: Secondary | ICD-10-CM | POA: Diagnosis present

## 2024-10-26 DIAGNOSIS — Z87891 Personal history of nicotine dependence: Secondary | ICD-10-CM | POA: Diagnosis not present

## 2024-10-26 DIAGNOSIS — F419 Anxiety disorder, unspecified: Secondary | ICD-10-CM | POA: Diagnosis present

## 2024-10-26 DIAGNOSIS — Z3A40 40 weeks gestation of pregnancy: Secondary | ICD-10-CM

## 2024-10-26 DIAGNOSIS — O480 Post-term pregnancy: Secondary | ICD-10-CM | POA: Diagnosis present

## 2024-10-26 DIAGNOSIS — O411290 Chorioamnionitis, unspecified trimester, not applicable or unspecified: Secondary | ICD-10-CM | POA: Diagnosis not present

## 2024-10-26 DIAGNOSIS — O99344 Other mental disorders complicating childbirth: Secondary | ICD-10-CM | POA: Diagnosis present

## 2024-10-26 DIAGNOSIS — F1290 Cannabis use, unspecified, uncomplicated: Secondary | ICD-10-CM | POA: Diagnosis present

## 2024-10-26 DIAGNOSIS — Z3A41 41 weeks gestation of pregnancy: Secondary | ICD-10-CM | POA: Diagnosis not present

## 2024-10-26 LAB — TYPE AND SCREEN
ABO/RH(D): O POS
Antibody Screen: NEGATIVE

## 2024-10-26 LAB — CBC
HCT: 37.2 % (ref 36.0–46.0)
Hemoglobin: 13.4 g/dL (ref 12.0–15.0)
MCH: 33 pg (ref 26.0–34.0)
MCHC: 36 g/dL (ref 30.0–36.0)
MCV: 91.6 fL (ref 80.0–100.0)
Platelets: 118 K/uL — ABNORMAL LOW (ref 150–400)
RBC: 4.06 MIL/uL (ref 3.87–5.11)
RDW: 13.2 % (ref 11.5–15.5)
WBC: 8.2 K/uL (ref 4.0–10.5)
nRBC: 0 % (ref 0.0–0.2)

## 2024-10-26 MED ORDER — HYDROXYZINE HCL 25 MG PO TABS
25.0000 mg | ORAL_TABLET | Freq: Three times a day (TID) | ORAL | Status: DC | PRN
  Administered 2024-10-27 (×2): 25 mg via ORAL
  Filled 2024-10-26 (×2): qty 1

## 2024-10-26 MED ORDER — OXYCODONE-ACETAMINOPHEN 5-325 MG PO TABS: 2.0000 | ORAL_TABLET | ORAL | Status: DC | PRN

## 2024-10-26 MED ORDER — FAMOTIDINE IN NACL 20-0.9 MG/50ML-% IV SOLN
20.0000 mg | Freq: Two times a day (BID) | INTRAVENOUS | Status: DC | PRN
  Administered 2024-10-26: 20 mg via INTRAVENOUS
  Filled 2024-10-26: qty 50

## 2024-10-26 MED ORDER — TERBUTALINE SULFATE 1 MG/ML IJ SOLN
0.2500 mg | Freq: Once | INTRAMUSCULAR | Status: AC | PRN
  Administered 2024-10-27: 0.25 mg via SUBCUTANEOUS

## 2024-10-26 MED ORDER — SOD CITRATE-CITRIC ACID 500-334 MG/5ML PO SOLN: 30.0000 mL | ORAL | Status: DC | PRN

## 2024-10-26 MED ORDER — PHENYLEPHRINE 80 MCG/ML (10ML) SYRINGE FOR IV PUSH (FOR BLOOD PRESSURE SUPPORT): 80.0000 ug | PREFILLED_SYRINGE | INTRAVENOUS | Status: DC | PRN

## 2024-10-26 MED ORDER — OXYTOCIN-SODIUM CHLORIDE 30-0.9 UT/500ML-% IV SOLN
2.5000 [IU]/h | INTRAVENOUS | Status: DC
  Administered 2024-10-28: 2.5 [IU]/h via INTRAVENOUS

## 2024-10-26 MED ORDER — MISOPROSTOL 25 MCG QUARTER TABLET
25.0000 ug | ORAL_TABLET | ORAL | Status: DC | PRN
  Administered 2024-10-26: 25 ug via VAGINAL
  Filled 2024-10-26: qty 1

## 2024-10-26 MED ORDER — EPHEDRINE 5 MG/ML INJ
10.0000 mg | INTRAVENOUS | Status: DC | PRN
Start: 2024-10-26 — End: 2024-10-28

## 2024-10-26 MED ORDER — OXYTOCIN BOLUS FROM INFUSION
333.0000 mL | Freq: Once | INTRAVENOUS | Status: AC
  Administered 2024-10-27: 333 mL via INTRAVENOUS

## 2024-10-26 MED ORDER — FENTANYL-BUPIVACAINE-NACL 0.5-0.125-0.9 MG/250ML-% EP SOLN
12.0000 mL/h | EPIDURAL | Status: DC | PRN
  Administered 2024-10-26 – 2024-10-27 (×3): 12 mL/h via EPIDURAL
  Filled 2024-10-26 (×2): qty 250

## 2024-10-26 MED ORDER — LACTATED RINGERS IV SOLN
500.0000 mL | INTRAVENOUS | Status: AC | PRN
  Administered 2024-10-27: 500 mL via INTRAVENOUS

## 2024-10-26 MED ORDER — ACETAMINOPHEN 325 MG PO TABS: 650.0000 mg | ORAL_TABLET | ORAL | Status: DC | PRN

## 2024-10-26 MED ORDER — TERBUTALINE SULFATE 1 MG/ML IJ SOLN
0.2500 mg | Freq: Once | INTRAMUSCULAR | Status: DC | PRN
  Filled 2024-10-26: qty 1

## 2024-10-26 MED ORDER — LACTATED RINGERS IV SOLN: INTRAVENOUS | Status: AC

## 2024-10-26 MED ORDER — OXYCODONE-ACETAMINOPHEN 5-325 MG PO TABS: 1.0000 | ORAL_TABLET | ORAL | Status: DC | PRN

## 2024-10-26 MED ORDER — ONDANSETRON HCL 4 MG/2ML IJ SOLN: 4.0000 mg | Freq: Four times a day (QID) | INTRAMUSCULAR | Status: DC | PRN

## 2024-10-26 MED ORDER — LIDOCAINE HCL (PF) 1 % IJ SOLN: 30.0000 mL | INTRAMUSCULAR | Status: DC | PRN

## 2024-10-26 MED ORDER — LACTATED RINGERS IV SOLN: 500.0000 mL | Freq: Once | INTRAVENOUS | Status: DC

## 2024-10-26 MED ORDER — DIPHENHYDRAMINE HCL 50 MG/ML IJ SOLN: 12.5000 mg | INTRAMUSCULAR | Status: DC | PRN

## 2024-10-26 MED ORDER — OXYTOCIN-SODIUM CHLORIDE 30-0.9 UT/500ML-% IV SOLN
1.0000 m[IU]/min | INTRAVENOUS | Status: DC
  Administered 2024-10-27: 1 m[IU]/min via INTRAVENOUS
  Filled 2024-10-26: qty 500

## 2024-10-26 NOTE — H&P (Signed)
 Ashley Navarro is a 28 y.o. female presenting for IOL for postdates. OB History     Gravida  1   Para  0   Term  0   Preterm  0   AB  0   Living  0      SAB  0   IAB  0   Ectopic  0   Multiple  0   Live Births  0          Past Medical History:  Diagnosis Date   Ovarian cyst 2022   Tooth decay    Past Surgical History:  Procedure Laterality Date   MULTIPLE TOOTH EXTRACTIONS     TYMPANOSTOMY TUBE PLACEMENT     Family History: family history is not on file. Social History:  reports that she has quit smoking. Her smoking use included cigarettes. She has never used smokeless tobacco. She reports that she does not currently use alcohol. She reports current drug use. Drug: Marijuana.     Maternal Diabetes: No Genetic Screening: Normal Maternal Ultrasounds/Referrals: Normal Fetal Ultrasounds or other Referrals:  None Maternal Substance Abuse:  Yes:  Type: Marijuana Significant Maternal Medications:  None Significant Maternal Lab Results:  Group B Strep negative Number of Prenatal Visits:greater than 3 verified prenatal visits Maternal Vaccinations: declined Other Comments:  None  Review of Systems History   Blood pressure 126/81, pulse 79, temperature 99.1 F (37.3 C), temperature source Oral, resp. rate 16, height 5' 2 (1.575 m), weight 69 kg, last menstrual period 01/14/2024. Exam Physical Exam Vitals and nursing note reviewed.  Constitutional:      Appearance: Normal appearance.  Cardiovascular:     Rate and Rhythm: Normal rate and regular rhythm.  Pulmonary:     Effort: Pulmonary effort is normal.  Abdominal:     General: Abdomen is flat.     Palpations: Abdomen is soft.  Skin:    General: Skin is warm and dry.     Capillary Refill: Capillary refill takes less than 2 seconds.  Neurological:     General: No focal deficit present.     Mental Status: She is alert.  Psychiatric:        Mood and Affect: Mood normal.        Behavior: Behavior  normal.        Thought Content: Thought content normal.        Judgment: Judgment normal.   Dilation: 1.5 Effacement (%): 50 Station: -3 Presentation: Vertex Exam by:: Ashley Navarro, RNC-OB'   Prenatal labs: ABO, Rh: --/--/PENDING (10/27 1316) Antibody: PENDING (10/27 1316) Rubella: Immune (03/13 0000) RPR: Nonreactive (03/13 0000)  HBsAg: Negative (03/13 0000)  HIV: Non-reactive (03/13 0000)  GBS: Negative/-- (09/29 0000)   Assessment/Plan: Boy anticipated Circumcision desired for the baby Plans on breastfeeding.  Start with cytotec.     Ashley Navarro 10/26/2024, 2:35 PM

## 2024-10-26 NOTE — Progress Notes (Signed)
 Labor Progress Note Ashley Navarro is a 28 y.o. G1P0000 at [redacted]w[redacted]d presenting for IOL for postdates  S: Patent breathing through some contractions. Requesting AROM over pitocin or FB  O:  BP 128/70   Pulse 63   Temp 99.1 F (37.3 C) (Oral)   Resp 16   Ht 5' 2 (1.575 m)   Wt 69 kg   LMP 01/14/2024 (Exact Date)   BMI 27.82 kg/m  Lab Results  Component Value Date   HGB 13.4 10/26/2024    Time: 9:50 PM  FHT: baseline bpm 150, moderate variability, accelerations present, decelerations none,   Contractions: q 1-2 mins,    CVE: Dilation: 2.5 Effacement (%): 70 Station: -3 Presentation: Vertex Exam by:: Dr. Trudy AROM with clear fluid   A&P: 28 y.o. G1P0000 [redacted]w[redacted]d IOL for postdates #Labor: Latent Labor AROM and Cytotec  #Pain: Epidural when desired #FWB: Category I #GBS negative #marijuana use/tobacco use during pregnancy  Ashley Navarro [name] Trudy, MD 9:50 PM

## 2024-10-27 ENCOUNTER — Inpatient Hospital Stay (HOSPITAL_COMMUNITY): Admitting: Anesthesiology

## 2024-10-27 DIAGNOSIS — O99324 Drug use complicating childbirth: Secondary | ICD-10-CM

## 2024-10-27 DIAGNOSIS — O480 Post-term pregnancy: Secondary | ICD-10-CM

## 2024-10-27 DIAGNOSIS — Z3A41 41 weeks gestation of pregnancy: Secondary | ICD-10-CM

## 2024-10-27 DIAGNOSIS — O411230 Chorioamnionitis, third trimester, not applicable or unspecified: Secondary | ICD-10-CM

## 2024-10-27 LAB — RPR: RPR Ser Ql: NONREACTIVE

## 2024-10-27 MED ORDER — SODIUM CHLORIDE 0.9 % IV SOLN
INTRAVENOUS | Status: DC | PRN
  Administered 2024-10-27: 6 mL via EPIDURAL

## 2024-10-27 MED ORDER — LIDOCAINE HCL (PF) 1 % IJ SOLN
INTRAMUSCULAR | Status: DC | PRN
  Administered 2024-10-27: 3 mL via SUBCUTANEOUS

## 2024-10-27 MED ORDER — GENTAMICIN SULFATE 40 MG/ML IJ SOLN
5.0000 mg/kg | Freq: Once | INTRAVENOUS | Status: AC
  Administered 2024-10-27: 350 mg via INTRAVENOUS
  Filled 2024-10-27: qty 8.75

## 2024-10-27 MED ORDER — FENTANYL CITRATE (PF) 100 MCG/2ML IJ SOLN
INTRAMUSCULAR | Status: AC
  Administered 2024-10-27: 100 ug via EPIDURAL
  Filled 2024-10-27: qty 2

## 2024-10-27 MED ORDER — SODIUM CHLORIDE 0.9 % IV SOLN
2.0000 g | Freq: Once | INTRAVENOUS | Status: DC
  Administered 2024-10-28: 2 g via INTRAVENOUS
  Filled 2024-10-27: qty 2000

## 2024-10-27 MED ORDER — ACETAMINOPHEN 500 MG PO TABS
1000.0000 mg | ORAL_TABLET | Freq: Four times a day (QID) | ORAL | Status: DC | PRN
  Administered 2024-10-27: 1000 mg via ORAL
  Filled 2024-10-27: qty 2

## 2024-10-27 MED ORDER — BUPIVACAINE HCL (PF) 0.25 % IJ SOLN
INTRAMUSCULAR | Status: DC | PRN
  Administered 2024-10-27: 5 mL via EPIDURAL

## 2024-10-27 MED ORDER — LIDOCAINE-EPINEPHRINE (PF) 2 %-1:200000 IJ SOLN
INTRAMUSCULAR | Status: DC | PRN
  Administered 2024-10-27: 3 mL via EPIDURAL

## 2024-10-27 MED ORDER — FENTANYL CITRATE (PF) 100 MCG/2ML IJ SOLN
INTRAMUSCULAR | Status: DC | PRN
  Administered 2024-10-27: 100 ug via EPIDURAL

## 2024-10-27 MED ORDER — TERBUTALINE SULFATE 1 MG/ML IJ SOLN: 0.2500 mg | Freq: Once | INTRAMUSCULAR | Status: DC | PRN

## 2024-10-27 MED ORDER — LACTATED RINGERS AMNIOINFUSION
INTRAVENOUS | Status: DC
Start: 2024-10-27 — End: 2024-10-28

## 2024-10-27 MED ORDER — OXYTOCIN-SODIUM CHLORIDE 30-0.9 UT/500ML-% IV SOLN: 1.0000 m[IU]/min | INTRAVENOUS | Status: DC

## 2024-10-27 NOTE — Progress Notes (Addendum)
 Labor Progress Note Ashley Navarro is a 28 y.o. G1P0000 at [redacted]w[redacted]d presented for IOL for postdates  S: Patient is resting comfortably. Appeared anxious. Family at bedside  O:  BP (!) 106/57   Pulse 71   Temp 97.9 F (36.6 C) (Oral)   Resp 16   Ht 5' 2 (1.575 m)   Wt 69 kg   LMP 01/14/2024 (Exact Date)   SpO2 97%   BMI 27.82 kg/m  EFM: 140s/Good Variability/+ Acel Early Decel  CVE: Dilation: 6 Effacement (%): 80 Station: -1 Presentation: Vertex Exam by:: Dr Trudy   A&P: 28 y.o. G1P0000 [redacted]w[redacted]d IOL for postdates  #Labor: Progressing slowly. Vmiso at 1500 yesterday, AROM 2200 yesterday #Pain: Per patient requested #FWB: Category 2 #GBS negative  #anxiety Continue Atarax 3BID PRN  Kulkaew Suknaim, DO PGY1 Family Medicine Resident 10:43 AM   Attestation of Supervision of Student:  I confirm that I have verified the information documented in the resident's note and that I have also personally reperformed the history, physical exam and all medical decision making activities.  I have verified that all services and findings are accurately documented in this student's note; and I agree with management and plan as outlined in the documentation. I have also made any necessary editorial changes.    Barkley LITTIE Angles, MD OB Fellow 10/27/2024 7:42 PM

## 2024-10-27 NOTE — Anesthesia Preprocedure Evaluation (Signed)
 Anesthesia Evaluation  Patient identified by MRN, date of birth, ID band Patient awake    Reviewed: Allergy & Precautions, NPO status , Patient's Chart, lab work & pertinent test results  History of Anesthesia Complications Negative for: history of anesthetic complications  Airway Mallampati: II  TM Distance: >3 FB Neck ROM: Full    Dental  (+) Upper Dentures   Pulmonary neg pulmonary ROS, neg sleep apnea, neg COPD, Patient abstained from smoking.Not current smoker, former smoker   Pulmonary exam normal breath sounds clear to auscultation       Cardiovascular Exercise Tolerance: Good METS(-) hypertension(-) CAD and (-) Past MI negative cardio ROS (-) dysrhythmias  Rhythm:Regular Rate:Normal - Systolic murmurs    Neuro/Psych negative neurological ROS  negative psych ROS   GI/Hepatic ,neg GERD  ,,(+)     (-) substance abuse    Endo/Other  neg diabetes    Renal/GU negative Renal ROS     Musculoskeletal   Abdominal   Peds  Hematology Denies blood thinner use or bleeding disorders.    Anesthesia Other Findings Denies blood thinner use or bleeding diatheses. Recent labs reviewed. Past Medical History: 2022: Ovarian cyst No date: Tooth decay   Reproductive/Obstetrics (+) Pregnancy                              Anesthesia Physical Anesthesia Plan  ASA: 2  Anesthesia Plan: Epidural   Post-op Pain Management:    Induction:   PONV Risk Score and Plan: 2 and Treatment may vary due to age or medical condition and Ondansetron   Airway Management Planned: Natural Airway  Additional Equipment:   Intra-op Plan:   Post-operative Plan:   Informed Consent: I have reviewed the patients History and Physical, chart, labs and discussed the procedure including the risks, benefits and alternatives for the proposed anesthesia with the patient or authorized representative who has indicated his/her  understanding and acceptance.       Plan Discussed with: Surgeon  Anesthesia Plan Comments: (Discussed R/B/A of neuraxial anesthesia technique with patient: - rare risks of spinal/epidural hematoma, nerve damage, infection - Risk of PDPH - Risk of itching - Risk of nausea and vomiting - Risk of poor block necessitating replacement of epidural. - Risk of allergic reactions. Patient voiced understanding.)        Anesthesia Quick Evaluation

## 2024-10-27 NOTE — Progress Notes (Signed)
 Labor Progress Note Ashley Navarro is a 28 y.o. G1P0000 at [redacted]w[redacted]d presenting for IOL for postdates  S: Called to room by RN for 4 min prolonged decleration  O:  BP 120/70   Pulse 60   Temp 97.8 F (36.6 C) (Oral)   Resp 16   Ht 5' 2 (1.575 m)   Wt 69 kg   LMP 01/14/2024 (Exact Date)   SpO2 97%   BMI 27.82 kg/m  Lab Results  Component Value Date   HGB 13.4 10/26/2024    Time: 6:55 AM  FHT: baseline bpm 130, moderate variability, accelerations present, decelerations--4 min prolonged, late  Contractions: q every 2 min mins,    CVE: Dilation: 4 Effacement (%): 80 Station: -1 Presentation: Vertex Exam by:: Dr Trudy   A&P: 28 y.o. G1P0000 [redacted]w[redacted]d  #Labor: Latent Labor AROM, Cytotec, and IP Foley IUPC and FSE placed #Pain: Epidural #FWB: Category II #GBS negative   [name] [name] Trudy, MD 6:55 AM

## 2024-10-27 NOTE — Anesthesia Procedure Notes (Signed)
 Epidural Patient location during procedure: OB Start time: 10/27/2024 12:41 AM End time: 10/27/2024 12:48 AM  Staffing Anesthesiologist: Boone Fess, MD Performed: anesthesiologist   Preanesthetic Checklist Completed: patient identified, IV checked, site marked, risks and benefits discussed, surgical consent, monitors and equipment checked, pre-op evaluation and timeout performed  Epidural Patient position: sitting Prep: ChloraPrep Patient monitoring: heart rate, continuous pulse ox and blood pressure Approach: midline Location: L4-L5 Injection technique: LOR saline  Needle:  Needle type: Tuohy  Needle gauge: 17 G Needle length: 9 cm Needle insertion depth: 6 cm Catheter type: closed end flexible Catheter size: 19 Gauge Catheter at skin depth: 11 cm Test dose: negative and 1.5% lidocaine with Epi 1:200 K  Assessment Sensory level: T10 Events: blood not aspirated, no cerebrospinal fluid, injection not painful, no injection resistance, no paresthesia and negative IV test  Additional Notes First/one attempt Pt. Evaluated and documentation done after procedure finished. Patient identified. Risks/Benefits/Options discussed with patient including but not limited to bleeding, infection, nerve damage, paralysis, failed block, incomplete pain control, headache, blood pressure changes, nausea, vomiting, reactions to medication both or allergic, itching and postpartum back pain. Confirmed with bedside nurse the patient's most recent platelet count. Confirmed with patient that they are not currently taking any anticoagulation, have any bleeding history or any family history of bleeding disorders. Patient expressed understanding and wished to proceed. All questions were answered. Sterile technique was used throughout the entire procedure. Please see nursing notes for vital signs. Test dose was given through epidural catheter and negative prior to continuing to dose epidural or start infusion.  Warning signs of high block given to the patient including shortness of breath, tingling/numbness in hands, complete motor block, or any concerning symptoms with instructions to call for help. Patient was given instructions on fall risk and not to get out of bed. All questions and concerns addressed with instructions to call with any issues or inadequate analgesia.     Patient tolerated the insertion well without immediate complications.  Reason for block: procedure for painReason for block:procedure for pain

## 2024-10-28 ENCOUNTER — Inpatient Hospital Stay (HOSPITAL_COMMUNITY)

## 2024-10-28 ENCOUNTER — Encounter (HOSPITAL_COMMUNITY): Payer: Self-pay | Admitting: Family Medicine

## 2024-10-28 LAB — CBC
HCT: 33 % — ABNORMAL LOW (ref 36.0–46.0)
Hemoglobin: 11.6 g/dL — ABNORMAL LOW (ref 12.0–15.0)
MCH: 33.1 pg (ref 26.0–34.0)
MCHC: 35.2 g/dL (ref 30.0–36.0)
MCV: 94.3 fL (ref 80.0–100.0)
Platelets: 114 K/uL — ABNORMAL LOW (ref 150–400)
RBC: 3.5 MIL/uL — ABNORMAL LOW (ref 3.87–5.11)
RDW: 13.5 % (ref 11.5–15.5)
WBC: 17.2 K/uL — ABNORMAL HIGH (ref 4.0–10.5)
nRBC: 0 % (ref 0.0–0.2)

## 2024-10-28 MED ORDER — IBUPROFEN 600 MG PO TABS
600.0000 mg | ORAL_TABLET | Freq: Four times a day (QID) | ORAL | Status: DC
  Administered 2024-10-28 – 2024-10-29 (×5): 600 mg via ORAL
  Filled 2024-10-28 (×5): qty 1

## 2024-10-28 MED ORDER — COCONUT OIL OIL: 1.0000 | TOPICAL_OIL | Status: DC | PRN

## 2024-10-28 MED ORDER — ACETAMINOPHEN 500 MG PO TABS
1000.0000 mg | ORAL_TABLET | Freq: Four times a day (QID) | ORAL | Status: DC | PRN
  Administered 2024-10-28 – 2024-10-29 (×2): 1000 mg via ORAL
  Filled 2024-10-28 (×2): qty 2

## 2024-10-28 MED ORDER — SENNOSIDES-DOCUSATE SODIUM 8.6-50 MG PO TABS
2.0000 | ORAL_TABLET | Freq: Every day | ORAL | Status: DC
  Administered 2024-10-29: 2 via ORAL
  Filled 2024-10-28: qty 2

## 2024-10-28 MED ORDER — BENZOCAINE-MENTHOL 20-0.5 % EX AERO
1.0000 | INHALATION_SPRAY | CUTANEOUS | Status: DC | PRN
  Administered 2024-10-28: 1 via TOPICAL
  Filled 2024-10-28: qty 56

## 2024-10-28 MED ORDER — DIBUCAINE (PERIANAL) 1 % EX OINT: 1.0000 | TOPICAL_OINTMENT | CUTANEOUS | Status: DC | PRN

## 2024-10-28 MED ORDER — OXYCODONE HCL 5 MG PO TABS: 10.0000 mg | ORAL_TABLET | ORAL | Status: DC | PRN

## 2024-10-28 MED ORDER — PRENATAL MULTIVITAMIN CH
1.0000 | ORAL_TABLET | Freq: Every day | ORAL | Status: DC
  Administered 2024-10-28: 1 via ORAL
  Filled 2024-10-28: qty 1

## 2024-10-28 MED ORDER — MENTHOL 3 MG MT LOZG: 1.0000 | LOZENGE | OROMUCOSAL | Status: DC | PRN

## 2024-10-28 MED ORDER — OXYCODONE HCL 5 MG PO TABS
5.0000 mg | ORAL_TABLET | ORAL | Status: DC | PRN
  Administered 2024-10-28: 5 mg via ORAL
  Filled 2024-10-28: qty 1

## 2024-10-28 MED ORDER — WITCH HAZEL-GLYCERIN EX PADS
1.0000 | MEDICATED_PAD | CUTANEOUS | Status: DC | PRN
  Administered 2024-10-28: 1 via TOPICAL

## 2024-10-28 MED ORDER — SIMETHICONE 80 MG PO CHEW: 80.0000 mg | CHEWABLE_TABLET | ORAL | Status: DC | PRN

## 2024-10-28 MED ORDER — ONDANSETRON HCL 4 MG/2ML IJ SOLN: 4.0000 mg | INTRAMUSCULAR | Status: DC | PRN

## 2024-10-28 MED ORDER — ONDANSETRON HCL 4 MG PO TABS: 4.0000 mg | ORAL_TABLET | ORAL | Status: DC | PRN

## 2024-10-28 NOTE — Lactation Note (Signed)
 This note was copied from a baby's chart. Lactation Consultation Note  Patient Name: Boy Shannan Slinker Unijb'd Date: 10/28/2024 Age:27 hours Reason for consult: Follow-up assessment;NICU baby;Term  P1, Baby transferred to the NICU due to temperature instability.  Maternal temperature and Chorio infection. Mother in NICU visiting baby when [facility] entered room.  Father of baby in room.    Set up DEBP and suggest mother call when she returns to the room for instruction from RN or LC.  RN aware.  Maternal Data Does the patient have breastfeeding experience prior to this delivery?: No  Feeding Mother's Current Feeding Choice: Breast Milk and Donor Milk   Lactation Tools Discussed/Used Tools: Pump  Discharge Pump: Personal  Consult Status Consult Status: NICU follow-up Date: 10/28/24 Follow-up type: In-patient    Shannon Dines [facility] 10/28/2024, 12:06 PM

## 2024-10-28 NOTE — Progress Notes (Addendum)
 POSTPARTUM PROGRESS NOTE  Post Partum Day 1  Subjective:  Ashley Navarro is a 28 y.o. G1P1001 s/p SVD at [redacted]w[redacted]d.  She reports she is doing well. No acute events overnight. She denies any problems with ambulating, voiding or po intake. Denies nausea or vomiting.  Pain is moderately controlled.  Lochia is Normal.  Objective: Blood pressure (!) 105/55, pulse 61, temperature 98.6 F (37 C), temperature source Oral, resp. rate 16, height 5' 2 (1.575 m), weight 69 kg, last menstrual period 01/14/2024, SpO2 98%, unknown if currently breastfeeding.  BP Readings from Last 3 Encounters:  10/28/24 (!) 105/55  10/08/24 115/70  08/25/24 107/68    Physical Exam:  General: alert, cooperative and no distress Chest: no respiratory distress Heart:regular rate, distal pulses intact Uterine Fundus: firm, appropriately tender DVT Evaluation: No calf swelling or tenderness Extremities: none edema Skin: warm, dry  Recent Labs    10/26/24 1306 10/28/24 0448  HGB 13.4 11.6*  HCT 37.2 33.0*    Assessment/Plan: Ashley Navarro is a 28 y.o. G1P1001 s/p NSVD at [redacted]w[redacted]d   PPD# 1 - Doing well  Routine postpartum care  Delivery Complications: none  Blood Pressure: normal Anemia/Hb Status: Stable, appropriate postpartum Hb, no intervention indicated Contraception: Condoms Feeding: breast feeding Needs Lactation Consultation: Yes    Dispo: Plan for discharge tomorrow.   LOS: 2 days   [name], MD Family Medicine Resident 10/28/2024, 6:10 AM   Attestation of Supervision of Resident: Evaluation and management procedures were performed by the learners: Family Medicine Resident under my supervision. I was immediately available for direct supervision, assistance and direction throughout this encounter.  I also confirm that I have verified the information documented in the resident's note, and that I have also personally reperformed the pertinent components of the physical exam and all of  the medical decision making activities.  I have also made any necessary editorial changes.  Charlie [name] Courts, MD [facility] Fellow Center for [facility]

## 2024-10-28 NOTE — Discharge Summary (Signed)
 Postpartum Discharge Summary  Date of Service updated***     Patient Name: Ashley Navarro DOB: 05-07-1996 MRN: [id number]  Date of admission: 10/26/2024 Delivery date:10/27/2024 Delivering provider: TRUDY CZAR B Date of discharge: 10/28/2024  Admitting diagnosis: Indication for care in labor or delivery [O75.9] Intrauterine pregnancy: [redacted]w[redacted]d     Secondary diagnosis:  Principal Problem:   Indication for care in labor or delivery  Additional problems: Triple I (developed in active stage of labor, complete)    Discharge diagnosis: Term Pregnancy Delivered                                              Post partum procedures:*** Augmentation: AROM, Pitocin, Cytotec, and IP Foley Complications: None  Hospital course: Induction of Labor With Vaginal Delivery      28 y.o. yo G1P0000 at [redacted]w[redacted]d was admitted in Latent Labor on 10/26/2024. Labor course was complicated by Triple I.   Membrane Rupture Time/Date: 9:57 PM,10/26/2024  Delivery Method:Vaginal, Spontaneous Operative Delivery:N/A Episiotomy: None Lacerations:  2nd degree;Perineal Patient had a postpartum course complicated by ***.  She is ambulating, tolerating a regular diet, passing flatus, and urinating well. Patient is discharged home in stable condition on 10/28/24.  Newborn Data: Birth date:10/27/2024 Birth time:11:32 PM Gender:Female Living status:Living Apgars:5 ,7  Weight:   Magnesium Sulfate received: No BMZ received: No Rhophylac:No MMR:No T-DaP:Given prenatally Flu: Yes RSV Vaccine received: No Transfusion:No  Immunizations received:  There is no immunization history on file for this patient.  Physical exam  Vitals:   10/27/24 2001 10/27/24 2238 10/28/24 0002 10/28/24 0030  BP: (!) 113/55  110/61   Pulse: 90  74   Resp:      Temp:  (!) 102.3 F (39.1 C)  (!) 100.8 F (38.2 C)  TempSrc:  Oral  Oral  SpO2:      Weight:      Height:       General: {Exam; general:21111117} Lochia: {Desc;  appropriate/inappropriate:30686::appropriate} Uterine Fundus: {Desc; firm/soft:30687} Incision: {Exam; incision:21111123} DVT Evaluation: {Exam; dvt:2111122} Labs: Lab Results  Component Value Date   WBC 8.2 10/26/2024   HGB 13.4 10/26/2024   HCT 37.2 10/26/2024   MCV 91.6 10/26/2024   PLT 118 (L) 10/26/2024      Latest Ref Rng & Units 12/22/2020    5:12 PM  CMP  Glucose 70 - 99 mg/dL 96   BUN 6 - 20 mg/dL 15   Creatinine 9.55 - 1.00 mg/dL 9.34   Sodium 864 - 854 mmol/L 139   Potassium 3.5 - 5.1 mmol/L 3.5   Chloride 98 - 111 mmol/L 103   CO2 22 - 32 mmol/L 25   Calcium 8.9 - 10.3 mg/dL 8.9   Total Protein 6.5 - 8.1 g/dL 7.9   Total Bilirubin 0.3 - 1.2 mg/dL 0.8   Alkaline Phos 38 - 126 U/L 55   AST 15 - 41 U/L 19   ALT 0 - 44 U/L 17    Edinburgh Score:     No data to display         No data recorded  After visit meds:  Allergies as of 10/28/2024   No Known Allergies   Med Rec must be completed prior to using this [facility]***        Discharge home in stable condition Infant Feeding: Breast Infant Disposition:home with mother Discharge instruction:  per After Visit Summary and Postpartum booklet. Activity: Advance as tolerated. Pelvic rest for 6 weeks.  Diet: routine diet Future Appointments:No future appointments. Follow up Visit:  ***Has not been sent Please schedule this patient for a In person postpartum visit in 6 weeks with the following provider: Any provider. Additional Postpartum F/U:Not needed.  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    10/28/2024 [name], MD

## 2024-10-28 NOTE — Clinical Social Work Maternal (Addendum)
 CLINICAL SOCIAL WORK MATERNAL/CHILD NOTE  Patient Details  Name: Ashley Navarro MRN: [id number] Date of Birth: 1996/01/14  Date:  10/28/2024  Clinical Social Worker Initiating Note:  Rosina Molt Date/Time: Initiated:  10/28/24/1555     Child's Name:  Ashley Navarro   Biological Parents:  Mother, Father Alima Naser 1996-11-26 Norman Naegeli 05-09-1996)   Need for Interpreter:  None   Reason for Referral:  Current Substance Use/Substance Use During Pregnancy     Address:  [address] [place] [address]    Phone number:  [phone] (home)     Additional phone number:    Household Members/Support Persons (HM/SP):   Household Member/Support Person 1   HM/SP Name Relationship DOB or Age  HM/SP -1 Norman Naegeli FOB 05-09-1996  HM/SP -2        HM/SP -3        HM/SP -4        HM/SP -5        HM/SP -6        HM/SP -7        HM/SP -8          Natural Supports (not living in the home):  Spouse/significant other, Parent   Professional Supports: None   Employment: Unemployed   Type of Work:     Education:  Engineer, agricultural   Homebound arranged:    Surveyor, Quantity Resources:  Medicaid   Other Resources:      Cultural/Religious Considerations Which May Impact Care:     Strengths:  Ability to meet basic needs  , Home prepared for child  , Pediatrician chosen   Psychotropic Medications:         Pediatrician:    [place]  Pediatrician List:   [facility]    [place]    [facility]  [facility]      Pediatrician Fax Number:    Risk Factors/Current Problems:  Substance Use     Cognitive State:  Alert  , Able to Concentrate  , Linear Thinking  , Insightful  , Goal Oriented     Mood/Affect:  Calm  , Comfortable  , Interested  , Relaxed     CSW Assessment: CSW received a consult due to [facility] use and NICU admission. CSW met MOB at bedside to complete a full psychosocial assessment and  offer support. CSW entered the room, introduced herself and explained the reason for the visit. MOB presented standing by the sink as she prepared her water bottle to go visit the infant in the NICU. MOB presented as calm, was agreeable to consult and remained engaged throughout encounter.  CSW collected MOB's demographic information and inquired about mental health history. MOB denied any all mental health history. CSW provided education regarding the baby blues period vs. perinatal mood disorders, discussed treatment and gave resources for mental health follow up if concerns arise.  CSW recommends self-evaluation during the postpartum time period using the New Mom Checklist from Postpartum Progress and encouraged MOB to contact a medical professional if symptoms are noted at any time.  CSW assessed for safety with MOB SI/HI/DV;MOB denied all.  CSW asked MOB does she receive support resources; MOB said No(WIC and food stamps).  MOB reported having all essential items for the infant including a carseat, bassinet and crib for safe sleeping. CSW provided review of Sudden Infant Death Syndrome (SIDS) precautions.   CSW informed MOB due to [facility]  during her pregnancy; the hospital will perform a UDS and CDS on the infant. If the screenings return with positive results a report to CPS will be made; MOB was understanding. MOB reported using THC one to two days a week during her pregnancy during the 1st and last trimester primarily. MOB reported the third trimester usage was due to constant pain and being nausea. MOB reported the first trimester THC usage was due to her not knowing that she was pregnant. MOB reported prior to pregnancy she used THC on a everyday basis. MOB denied the use of other illegal substances.  The infant's UDS was negative for all substances. CSW will continue to monitor the CDS and complete a CPS report if warranted.  CSW has discussed the NICU protocol with visitations, rooming in and  timely updates by support staff. MOB reported at this time she would like to be contacted by the NICU social worker 's on a as needed basis.  CSW Plan/Description:   No Further Intervention Required/No Barriers to Discharge, Sudden Infant Death Syndrome (SIDS) Education, Perinatal Mood and Anxiety Disorder (PMADs) Education, Hospital Drug Screen Policy Information, Other Information/Referral to [facility], CSW Will Continue to Monitor Umbilical Cord Tissue Drug Screen Results and Make Report if Ranelle Rosina MARLA Joshua, LCSW 10/28/2024, 4:07 PM

## 2024-10-28 NOTE — Lactation Note (Signed)
 This note was copied from a baby's chart. Lactation Consultation Note  Patient Name: Ashley Navarro Date: 10/28/2024 Age:27 hours Reason for consult: Initial assessment;1st time breastfeeding;Term.  P1, MOB attempted to latch infant on her left breast using the cross cradle hold with pillow support, infant latched only held nipple in mouth and did not elicit the suck and swallow response. Immediately afterwards MOB self expressed 5 mls of colostrum that was spoon fed to infant. MOB informed LC she has been hand expressing in the last trimester of her pregnancy and has frozen colostrum at home. MOB will continue to breastfeed infant by cues on demand, 8-12 times within 24 hours, skin to skin. MOB will continue to work towards latching infant at the breast, and knows if he does not latch to hand express and given colostrum back by spoon on Day 1 of life. MON will continue to ask for latch assistance if needed. LC discussed importance of maternal rest, meals and hydration. MOB was  made aware of O/P services, breastfeeding support groups, community resources, and our phone # for post-discharge questions.    Maternal Data Has patient been taught Hand Expression?: Yes Does the patient have breastfeeding experience prior to this delivery?: No  Feeding Mother's Current Feeding Choice: Breast Milk  LATCH Score Latch: Too sleepy or reluctant, no latch achieved, no sucking elicited.  Audible Swallowing: None  Type of Nipple: Everted at rest and after stimulation  Comfort (Breast/Nipple): Soft / non-tender  Hold (Positioning): Assistance needed to correctly position infant at breast and maintain latch.  LATCH Score: 5   Lactation Tools Discussed/Used    Interventions Interventions: Breast feeding basics reviewed;Assisted with latch;Skin to skin;Breast compression;Adjust position;Support pillows;Position options;LC Services brochure;Guidelines for Milk Supply and Pumping Schedule  Handout;CDC milk storage guidelines;CDC Guidelines for Breast Pump Cleaning  Discharge Pump: DEBP;Personal  Consult Status Consult Status: Follow-up Date: 10/28/24 Follow-up type: In-patient    [name] 10/28/2024, 2:46 AM

## 2024-10-29 DIAGNOSIS — O411290 Chorioamnionitis, unspecified trimester, not applicable or unspecified: Secondary | ICD-10-CM | POA: Diagnosis not present

## 2024-10-29 MED ORDER — IBUPROFEN 600 MG PO TABS: 600.0000 mg | ORAL_TABLET | Freq: Four times a day (QID) | ORAL | Status: AC

## 2024-10-29 MED ORDER — ACETAMINOPHEN 500 MG PO TABS: 1000.0000 mg | ORAL_TABLET | Freq: Four times a day (QID) | ORAL | Status: AC | PRN

## 2024-10-29 NOTE — Lactation Note (Signed)
 This note was copied from a baby's chart.  NICU Lactation Consultation Note  Patient Name: Ashley Navarro Date: 10/29/2024 Age:27 hours  Reason for consult: Follow-up assessment; NICU baby; Primapara; 1st time breastfeeding; Maternal discharge; Term; Mother's request; RN request; Breastfeeding assistance  SUBJECTIVE Visited with family of 52 73/29 weeks old NICU female; baby Ashley Navarro got admitted due to hypoglycemia and hypothermia. Ashley Navarro is a P1 and reported she's been pumping and also taking baby to breast; her goal is to exclusively breastfeed, praised her for all her efforts. She got discharged from the [facility] today. Reviewed discharge education, pumping schedule, pumping log and the importance of consistent pumping around baby's feeding times to protect her supply. Resized her flanges.  NICU RN Leotis D requested assistance for this feeding, baby already nursing on the L side in biological position when entered the room, per MOB this is the only position he likes, she tried cross cradle and football before. Showed her how to get a deeper latch by holding the back of baby's neck, family prefers to use blankets, which seems to be working. She was able to re-latch baby on her own every time he slipped off the breast. Tried a NS # 16 on the opposite breast but he didn't like it, started crying and would not re-latch as he did with the bare breast. D/C NS # 16 and he went back to the breast with a wide open mouth, several audible swallows noted (see LATCH score). Baby still nursing when exiting the room at the 35 minutes mark, he's on ad lib status.  OBJECTIVE Infant data: Mother's Current Feeding Choice: Breast Milk and Donor Milk  O2 Device: Room Air  Infant feeding assessment IDFTS - Readiness: 2 IDFTS - Quality: 3   Maternal data: [id number] Vaginal, Spontaneous Has patient been taught Hand Expression?: Yes Hand Expression Comments: colostrum easily expressed Significant  Breast History:: (++) breast changes during the pregnancy Current breast feeding challenges:: NICU admission Does the patient have breastfeeding experience prior to this delivery?: No Pumping frequency: 3 times/24 hours but she has also been taking baby to breast Pumped volume: 4 mL Flange Size: 18 Risk factor for low/delayed milk supply:: primipara, infant separation  Pump: Personal, Hands Free (wearable pump at home (unsure of brand))  ASSESSMENT Infant: Latch: Repeated attempts needed to sustain latch, nipple held in mouth throughout feeding, stimulation needed to elicit sucking reflex. Audible Swallowing: A few with stimulation Type of Nipple: Everted at rest and after stimulation Comfort (Breast/Nipple): Soft / non-tender Hold (Positioning): No assistance needed to correctly position infant at breast. (minimal assistance needed) LATCH Score: 8  Feeding Status: Ad lib Feeding method: Breast Nipple Type: Nfant Slow Flow (purple)  Maternal: Milk volume: Normal  INTERVENTIONS/PLAN Interventions: Interventions: Breast feeding basics reviewed; Assisted with latch; Breast massage; Hand express; Breast compression; Adjust position; Support pillows; DEBP; Education; NICU Pumping Log Discharge Education: Engorgement and breast care Tools: Pump; Flanges; Nipple Ashley Navarro Pump Education: Setup, frequency, and cleaning; Milk Storage Nipple shield size: 16  Plan: STS around care times Offer the breast +8 times/24 hours or sooner I feeding cues are present Pump both breasts after feedings/attempts at the breast to protect her supply Supplement with her EBM/donor milk ad lib  MGGM and MGM present and very supportive, they were also assisting by holding blankets for a deeper latch. All questions and concerns answered, family to contact [facility] services PRN.  Consult Status: NICU follow-up NICU Follow-up type: Baby's discharge   Ashley Navarro  Ashley Navarro 10/29/2024, 6:56 PM

## 2024-10-30 NOTE — Anesthesia Postprocedure Evaluation (Signed)
 Anesthesia Post Note  Patient: Ashley Navarro  Procedure(s) Performed: AN AD HOC LABOR EPIDURAL     Patient location during evaluation: Mother Baby Anesthesia Type: Epidural Level of consciousness: awake and alert Pain management: pain level controlled Vital Signs Assessment: post-procedure vital signs reviewed and stable Respiratory status: spontaneous breathing, nonlabored ventilation and respiratory function stable Cardiovascular status: stable Postop Assessment: no headache, no backache and epidural receding Anesthetic complications: no   No notable events documented.  Last Vitals: There were no vitals filed for this visit.  Last Pain: There were no vitals filed for this visit.               Garnette [name] Gab

## 2024-11-11 ENCOUNTER — Telehealth (HOSPITAL_COMMUNITY): Payer: Self-pay

## 2024-11-11 NOTE — Telephone Encounter (Signed)
 11/11/2024 1931  Name: Shanetta Nicolls MRN: [id number] DOB: [birth year]  Reason for Call:  Transition of Care Hospital Discharge Call  Contact Status: Patient Contact Status: Unable to contact  Language assistant needed:          Follow-Up Questions:    Van Postnatal Depression Scale:  In the Past 7 Days:    PHQ2-9 Depression Scale:     Discharge Follow-up:    Post-discharge interventions: NA  Signature  Rosaline Deretha PEAK
